# Patient Record
Sex: Female | Born: 1995 | Race: Black or African American | Hispanic: No | Marital: Single | Smoking: Never smoker
Health system: Southern US, Community
[De-identification: ages and names within clinical notes are randomized; demographics above are authoritative.]

## PROBLEM LIST (undated history)

## (undated) DIAGNOSIS — A6000 Herpesviral infection of urogenital system, unspecified: Secondary | ICD-10-CM

## (undated) DIAGNOSIS — J45909 Unspecified asthma, uncomplicated: Secondary | ICD-10-CM

---
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
## 2016-03-05 ENCOUNTER — Emergency Department
Admission: EM | Admit: 2016-03-05 | Discharge: 2016-03-05 | Disposition: A | Discharge status: Home / Self Care | Payer: Self-pay | Attending: Emergency Medicine | Admitting: Emergency Medicine

## 2016-03-05 ENCOUNTER — Encounter: Payer: Self-pay | Admitting: Emergency Medicine

## 2016-03-05 DIAGNOSIS — H109 Unspecified conjunctivitis: Secondary | ICD-10-CM | POA: Insufficient documentation

## 2016-03-05 HISTORY — DX: Herpesviral infection of urogenital system, unspecified: A60.00

## 2016-03-05 MED ORDER — GENTAMICIN SULFATE 0.3 % OP SOLN: 1.0000 [drp] | OPHTHALMIC | Status: DC

## 2016-03-05 NOTE — Discharge Instructions (Signed)
Bacterial Conjunctivitis Bacterial conjunctivitis (commonly called pink eye) is redness, soreness, or puffiness (inflammation) of the white part of your eye. It is caused by a germ called bacteria. These germs can easily spread from person to person (contagious). Your eye often will become red or pink. Your eye may also become irritated, watery, or have a thick discharge.  HOME CARE   Apply a cool, clean washcloth over closed eyelids. Do this for 10-20 minutes, 3-4 times a day while you have pain.  Gently wipe away any fluid coming from the eye with a warm, wet washcloth or cotton ball.  Wash your hands often with soap and water. Use paper towels to dry your hands.  Do not share towels or washcloths.  Change or wash your pillowcase every day.  Do not use eye makeup until the infection is gone.  Do not use machines or drive if your vision is blurry.  Stop using contact lenses. Do not use them again until your doctor says it is okay.  Do not touch the tip of the eye drop bottle or medicine tube with your fingers when you put medicine on the eye. GET HELP RIGHT AWAY IF:   Your eye is not better after 3 days of starting your medicine.  You have a yellowish fluid coming out of the eye.  You have more pain in the eye.  Your eye redness is spreading.  Your vision becomes blurry.  You have a fever or lasting symptoms for more than 2-3 days.  You have a fever and your symptoms suddenly get worse.  You have pain in the face.  Your face gets red or puffy (swollen). MAKE SURE YOU:   Understand these instructions.  Will watch this condition.  Will get help right away if you are not doing well or get worse.   This information is not intended to replace advice given to you by your health care provider. Make sure you discuss any questions you have with your health care provider.   Document Released: 05/19/2008 Document Revised: 07/27/2012 Document Reviewed: 04/15/2012 Elsevier  Interactive Patient Education 2016 [employer]  

## 2016-03-05 NOTE — ED Provider Notes (Signed)
[facility] Emergency Department Provider Note   ____________________________________________  Time seen: Approximately 10:55 AM  I have reviewed the triage vital signs and the nursing notes.   HISTORY  Chief Complaint Eye Drainage    HPI Deanna Soto is a 20 y.o. female patient complaining of left eye drainage and matted eyelids 3 days. Patient denies use of contact lenses. Patient denies any vision change. Patient state her significant other has the same complaint. Patient denies any URI signs symptoms. No palliative measures taken for this complaint. Patient rates her pain discomfort as a 6/10.   Past Medical History  Diagnosis Date  . Herpes genitalia     There are no active problems to display for this patient.   History reviewed. No pertinent past surgical history.  Current Outpatient Rx  Name  Route  Sig  Dispense  Refill  . gentamicin (GARAMYCIN) 0.3 % ophthalmic solution   Both Eyes   Place 1 drop into both eyes every 4 (four) hours.   5 mL   0     Allergies Review of patient's allergies indicates no known allergies.  No family history on file.  Social History Social History  Substance Use Topics  . Smoking status: Never Smoker   . Smokeless tobacco: None  . Alcohol Use: None    Review of Systems Constitutional: No fever/chills Eyes: No visual changes. Drainage, redness, and matted eyelid. ENT: No sore throat. Cardiovascular: Denies chest pain. Respiratory: Denies shortness of breath. Gastrointestinal: No abdominal pain.  No nausea, no vomiting.  No diarrhea.  No constipation. Genitourinary: Negative for dysuria. Musculoskeletal: Negative for back pain. Skin: Negative for rash. Neurological: Negative for headaches, focal weakness or numbness.    ____________________________________________   PHYSICAL EXAM:  VITAL SIGNS: ED Triage Vitals  Enc Vitals Group     BP 03/05/16 1052 137/83 mmHg     Pulse  Rate 03/05/16 1052 86     Resp 03/05/16 1052 18     Temp 03/05/16 1052 98.5 F (36.9 C)     Temp Source 03/05/16 1052 Oral     SpO2 03/05/16 1052 100 %     Weight 03/05/16 1052 180 lb (81.647 kg)     Height 03/05/16 1052  (1.676 m)     Head Cir --      Peak Flow --      Pain Score 03/05/16 1052 6     Pain Loc --      Pain Edu? --      Excl. in GC? --     Constitutional: Alert and oriented. Well appearing and in no acute distress. Eyes: Conjunctivae are Erythematous. PERRL. EOMI. matted eyelids and purulent discharge from the left eye. Head: Atraumatic. Nose: No congestion/rhinnorhea. Mouth/Throat: Mucous membranes are moist.  Oropharynx non-erythematous. Neck: No stridor.  No cervical spine tenderness to palpation. Hematological/Lymphatic/Immunilogical: No cervical lymphadenopathy. Cardiovascular: Normal rate, regular rhythm. Grossly normal heart sounds.  Good peripheral circulation. Respiratory: Normal respiratory effort.  No retractions. Lungs CTAB. Gastrointestinal: Soft and nontender. No distention. No abdominal bruits. No CVA tenderness. Musculoskeletal: No lower extremity tenderness nor edema.  No joint effusions. Neurologic:  Normal speech and language. No gross focal neurologic deficits are appreciated. No gait instability. Skin:  Skin is warm, dry and intact. No rash noted. Psychiatric: Mood and affect are normal. Speech and behavior are normal.  ____________________________________________   LABS (all labs ordered are listed, but only abnormal results are displayed)  Labs Reviewed - No  data to display ____________________________________________  EKG   ____________________________________________  RADIOLOGY   ____________________________________________   PROCEDURES  Procedure(s) performed: None  Procedures  Critical Care performed: No  ____________________________________________   INITIAL IMPRESSION / ASSESSMENT AND PLAN / ED  COURSE  Pertinent labs & imaging results that were available during my care of the patient were reviewed by me and considered in my medical decision making (see chart for details).  Bacterial conjunctivitis. She given discharge Instructions. Patient given a prescription for gentamicin eyedrops. Advised [facility] condition persists ____________________________________________   FINAL CLINICAL IMPRESSION(S) / ED DIAGNOSES  Final diagnoses:  Bacterial conjunctivitis of left eye      NEW MEDICATIONS STARTED DURING THIS VISIT:  New Prescriptions   GENTAMICIN (GARAMYCIN) 0.3 % OPHTHALMIC SOLUTION    Place 1 drop into both eyes every 4 (four) hours.     Note:  This document was prepared using Dragon voice recognition software and may include unintentional dictation errors.    Joni ReiningRonald K Smith, PA-C 03/05/16 1100  Sharyn CreamerMark Quale, MD 03/07/16 [phone]

## 2016-03-05 NOTE — ED Notes (Signed)
Left eye drainage , irritation x3 days

## 2016-03-10 ENCOUNTER — Encounter: Payer: Self-pay | Admitting: Emergency Medicine

## 2016-03-10 ENCOUNTER — Emergency Department
Admission: EM | Admit: 2016-03-10 | Discharge: 2016-03-10 | Disposition: A | Discharge status: Home / Self Care | Payer: Self-pay | Attending: Emergency Medicine | Admitting: Emergency Medicine

## 2016-03-10 DIAGNOSIS — H1033 Unspecified acute conjunctivitis, bilateral: Secondary | ICD-10-CM

## 2016-03-10 DIAGNOSIS — H109 Unspecified conjunctivitis: Secondary | ICD-10-CM | POA: Insufficient documentation

## 2016-03-10 MED ORDER — ERYTHROMYCIN 5 MG/GM OP OINT: 1.0000 "application " | TOPICAL_OINTMENT | Freq: Four times a day (QID) | OPHTHALMIC | Status: DC

## 2016-03-10 NOTE — ED Provider Notes (Signed)
[facility] Emergency Department Provider Note  ____________________________________________  Time seen: Approximately 1030 AM  I have reviewed the triage vital signs and the nursing notes.   HISTORY  Chief Complaint Conjunctivitis   HPI Deanna Soto is a 20 y.o. female presents with a complaint of redness and drainage to both eyes. Patient reports overall her symptoms have been present for 1 week. Patient states her boyfriend has the same complaints and states that she "got it from him ". Patient states in the last 5 days she has had redness, yellowish drainage and itching to her left eye with associated matting of eyelids,but reports the same symptoms started to her right eye yesterday.    Patient states that she was diagnosed and treated for pinkeye a fewdays after her boyfriend symptoms onset. Patient states that she was seen in the emergency room 5 days ago for the same complaint. Patient states she was given gentamicin eyedrops. Patient states that she was putting this in her left eye as she states at the time only her left eye was involved. Patient reports that she felt like she kept blinking after the eyedrops causing the eye drops to fall out of her eyes.Patient states that she now has symptoms in both her eyes. Reports some itching to both eyes. States eyes feel irritated, and states minimal pain discomfort at 2 out of 10. Denies vision changes but reports she does have some blurry vision only when the drainage is present in her eyes. Patient states when she removes the drainage her vision is normal. Patient states that she does not wear glasses or contacts but states she work glasses as a child and knows that she needs to be wearing glasses as an adult.  Denies any other triggers. Denies foreign bodies, chemical exposure, trauma. Denies foreign body sensation. Denies any surrounding eye pain. Denies fevers. Denies recent sickness, cough, congestion,  fevers or rash.  Patient's last menstrual period was now   Past Medical History  Diagnosis Date  . Herpes genitalia     There are no active problems to display for this patient.   History reviewed. No pertinent past surgical history.  Current Outpatient Rx  Name  Route  Sig  Dispense  Refill  .           Marland Kitchen             Allergies Review of patient's allergies indicates no known allergies.  No family history on file.  Social History Social History  Substance Use Topics  . Smoking status: Never Smoker   . Smokeless tobacco: None  . Alcohol Use: None    Review of Systems Constitutional: No fever/chills Eyes: No visual changes.As above.  ENT: No sore throat. Cardiovascular: Denies chest pain. Respiratory: Denies shortness of breath. Gastrointestinal: No abdominal pain.  No nausea, no vomiting.  No diarrhea.  No constipation. Genitourinary: Negative for dysuria. Musculoskeletal: Negative for back pain. Skin: Negative for rash. Neurological: Negative for headaches, focal weakness or numbness.  10-point ROS otherwise negative.  ____________________________________________   PHYSICAL EXAM:  VITAL SIGNS: ED Triage Vitals  Enc Vitals Group     BP 03/10/16 1031 134/83 mmHg     Pulse Rate 03/10/16 1031 95     Resp --      Temp 03/10/16 1031 98.3 F (36.8 C)     Temp Source 03/10/16 1031 Oral     SpO2 03/10/16 1031 100 %     Weight --  Height --      Head Cir --      Peak Flow --      Pain Score 03/10/16 1025 1     Pain Loc --      Pain Edu? --      Excl. in GC? --     Visual Acuity  Right Eye Distance: 20/40 Left Eye Distance: 20/40 Bilateral Distance: 20/50  Right Eye Near:   Left Eye Near:    Bilateral Near:      Constitutional: Alert and oriented. Well appearing and in no acute distress. Eyes: Bilateral conjunctivae with mild to moderate injection, bilateral eyes with mild whitish yellowish discharge with crusting around eyelids, no foreign  bodies seen, no surrounding swelling bilaterally, no surrounding erythema or rash. PERRL. EOMI. no pain with EOMs. Head: Atraumatic.  Ears: no erythema, normal TMs bilaterally.   Nose: No congestion/rhinnorhea.  Mouth/Throat: Mucous membranes are moist.  Oropharynx non-erythematous. Neck: No stridor.  No cervical spine tenderness to palpation. Cardiovascular: Normal rate, regular rhythm. Grossly normal heart sounds.  Good peripheral circulation. Respiratory: Normal respiratory effort.  No retractions. Lungs CTAB. No wheezes, rales or rhonchi. Gastrointestinal: Soft and nontender.  Musculoskeletal: Ambulatory with steady gait. Neurologic:  Normal speech and language.  Skin:  Skin is warm, dry and intact. No rash noted. Psychiatric: Mood and affect are normal. Speech and behavior are normal.    INITIAL IMPRESSION / ASSESSMENT AND PLAN / ED COURSE  Pertinent labs & imaging results that were available during my care of the patient were reviewed by me and considered in my medical decision making (see chart for details).  Well-appearing patient. No acute distress. Present for the complaints of bilateral eye redness, itching, drainage, matting and discharge. Patient was using gentamicin eyedrops are reports she felt like she was blinking the drops out and it was not staying in. Patient reports gradual progression to her right eye. Denies any other aggravating factors or triggers. Patient reports her boyfriend with the same complaints. Denies vision changes. Will treat bacterial conjunctivitis with erythromycin ointment. Instructed to follow-up with her primary care physician this week. Encourage ophthalmology follow-up if no improvement. Discussed seek immediate follow-up for vision change, eye pain or worsening concerns. Discussed indication, risks and benefits of medications with patient.  Discussed follow up with Primary care physician this week. Discussed follow up and return parameters including  no resolution or any worsening concerns. Patient verbalized understanding and agreed to plan.   ____________________________________________   FINAL CLINICAL IMPRESSION(S) / ED DIAGNOSES  Final diagnoses:  Acute bacterial conjunctivitis of both eyes     Discharge Medication List as of 03/10/2016 11:03 AM      Note: This dictation was prepared with Dragon dictation along with smaller phrase technology. Any transcriptional errors that result from this process are unintentional.       Renford DillsLindsey Myleka Moncure, NP 03/10/16 1155  Rockne MenghiniAnne-Caroline Norman, MD 03/10/16 1526

## 2016-03-10 NOTE — Discharge Instructions (Signed)
Use medication as prescribed. Practice good hand hygiene. Follow up with ophthalmology this week as discussed.   Follow up with your primary care physician this week as needed. Return to Urgent care for new or worsening concerns.    Bacterial Conjunctivitis Bacterial conjunctivitis, commonly called pink eye, is an inflammation of the clear membrane that covers the white part of the eye (conjunctiva). The inflammation can also happen on the underside of the eyelids. The blood vessels in the conjunctiva become inflamed, causing the eye to become red or pink. Bacterial conjunctivitis may spread easily from one eye to another and from person to person (contagious).  CAUSES  Bacterial conjunctivitis is caused by bacteria. The bacteria may come from your own skin, your upper respiratory tract, or from someone else with bacterial conjunctivitis. SYMPTOMS  The normally white color of the eye or the underside of the eyelid is usually pink or red. The pink eye is usually associated with irritation, tearing, and some sensitivity to light. Bacterial conjunctivitis is often associated with a thick, yellowish discharge from the eye. The discharge may turn into a crust on the eyelids overnight, which causes your eyelids to stick together. If a discharge is present, there may also be some blurred vision in the affected eye. DIAGNOSIS  Bacterial conjunctivitis is diagnosed by your caregiver through an eye exam and the symptoms that you report. Your caregiver looks for changes in the surface tissues of your eyes, which may point to the specific type of conjunctivitis. A sample of any discharge may be collected on a cotton-tip swab if you have a severe case of conjunctivitis, if your cornea is affected, or if you keep getting repeat infections that do not respond to treatment. The sample will be sent to a lab to see if the inflammation is caused by a bacterial infection and to see if the infection will respond to  antibiotic medicines. TREATMENT   Bacterial conjunctivitis is treated with antibiotics. Antibiotic eyedrops are most often used. However, antibiotic ointments are also available. Antibiotics pills are sometimes used. Artificial tears or eye washes may ease discomfort. HOME CARE INSTRUCTIONS   To ease discomfort, apply a cool, clean washcloth to your eye for 10-20 minutes, 3-4 times a day.  Gently wipe away any drainage from your eye with a warm, wet washcloth or a cotton ball.  Wash your hands often with soap and water. Use paper towels to dry your hands.  Do not share towels or washcloths. This may spread the infection.  Change or wash your pillowcase every day.  You should not use eye makeup until the infection is gone.  Do not operate machinery or drive if your vision is blurred.  Stop using contact lenses. Ask your caregiver how to sterilize or replace your contacts before using them again. This depends on the type of contact lenses that you use.  When applying medicine to the infected eye, do not touch the edge of your eyelid with the eyedrop bottle or ointment tube. SEEK IMMEDIATE MEDICAL CARE IF:   Your infection has not improved within 3 days after beginning treatment.  You had yellow discharge from your eye and it returns.  You have increased eye pain.  Your eye redness is spreading.  Your vision becomes blurred.  You have a fever or persistent symptoms for more than 2-3 days.  You have a fever and your symptoms suddenly get worse.  You have facial pain, redness, or swelling. MAKE SURE YOU:   Understand these instructions.  Will watch your condition.  Will get help right away if you are not doing well or get worse.   This information is not intended to replace advice given to you by your health care provider. Make sure you discuss any questions you have with your health care provider.   Document Released: 08/10/2005 Document Revised: 08/31/2014 Document  Reviewed: 01/11/2012 Elsevier Interactive Patient Education [employer].

## 2016-03-10 NOTE — ED Notes (Signed)
ENT, primary and fall assessment done at 1115

## 2016-03-10 NOTE — ED Notes (Signed)
C/o redness in both eyes.  No visual changes

## 2016-05-31 ENCOUNTER — Emergency Department: Payer: Self-pay

## 2016-05-31 ENCOUNTER — Encounter: Payer: Self-pay | Admitting: Emergency Medicine

## 2016-05-31 ENCOUNTER — Emergency Department
Admission: EM | Admit: 2016-05-31 | Discharge: 2016-05-31 | Disposition: A | Discharge status: Home / Self Care | Payer: Self-pay | Attending: Emergency Medicine | Admitting: Emergency Medicine

## 2016-05-31 DIAGNOSIS — R112 Nausea with vomiting, unspecified: Secondary | ICD-10-CM | POA: Insufficient documentation

## 2016-05-31 DIAGNOSIS — R102 Pelvic and perineal pain: Secondary | ICD-10-CM | POA: Insufficient documentation

## 2016-05-31 DIAGNOSIS — Z79899 Other long term (current) drug therapy: Secondary | ICD-10-CM | POA: Insufficient documentation

## 2016-05-31 HISTORY — DX: Unspecified asthma, uncomplicated: J45.909

## 2016-05-31 LAB — URINALYSIS COMPLETE WITH MICROSCOPIC (ARMC ONLY)
BILIRUBIN URINE: NEGATIVE
Bacteria, UA: NONE SEEN
Glucose, UA: NEGATIVE mg/dL
HGB URINE DIPSTICK: NEGATIVE
NITRITE: NEGATIVE
PH: 6 (ref 5.0–8.0)
Protein, ur: 100 mg/dL — AB
SPECIFIC GRAVITY, URINE: 1.028 (ref 1.005–1.030)

## 2016-05-31 LAB — COMPREHENSIVE METABOLIC PANEL
ALT: 10 U/L — ABNORMAL LOW (ref 14–54)
AST: 15 U/L (ref 15–41)
Albumin: 4.5 g/dL (ref 3.5–5.0)
Alkaline Phosphatase: 64 U/L (ref 38–126)
Anion gap: 9 (ref 5–15)
BUN: 7 mg/dL (ref 6–20)
CHLORIDE: 105 mmol/L (ref 101–111)
CO2: 25 mmol/L (ref 22–32)
CREATININE: 0.75 mg/dL (ref 0.44–1.00)
Calcium: 9.5 mg/dL (ref 8.9–10.3)
Glucose, Bld: 106 mg/dL — ABNORMAL HIGH (ref 65–99)
POTASSIUM: 3.3 mmol/L — AB (ref 3.5–5.1)
Sodium: 139 mmol/L (ref 135–145)
Total Bilirubin: 0.7 mg/dL (ref 0.3–1.2)
Total Protein: 7.8 g/dL (ref 6.5–8.1)

## 2016-05-31 LAB — CBC
HEMATOCRIT: 40.4 % (ref 35.0–47.0)
Hemoglobin: 14 g/dL (ref 12.0–16.0)
MCH: 29.2 pg (ref 26.0–34.0)
MCHC: 34.6 g/dL (ref 32.0–36.0)
MCV: 84.5 fL (ref 80.0–100.0)
PLATELETS: 295 10*3/uL (ref 150–440)
RBC: 4.79 MIL/uL (ref 3.80–5.20)
RDW: 14.6 % — ABNORMAL HIGH (ref 11.5–14.5)
WBC: 9.7 10*3/uL (ref 3.6–11.0)

## 2016-05-31 LAB — CHLAMYDIA/NGC RT PCR (ARMC ONLY)
CHLAMYDIA TR: DETECTED — AB
N gonorrhoeae: NOT DETECTED

## 2016-05-31 LAB — PREGNANCY, URINE: Preg Test, Ur: NEGATIVE

## 2016-05-31 LAB — LIPASE, BLOOD: LIPASE: 15 U/L (ref 11–51)

## 2016-05-31 MED ORDER — KETOROLAC TROMETHAMINE 30 MG/ML IJ SOLN
15.0000 mg | Freq: Once | INTRAMUSCULAR | Status: AC
  Administered 2016-05-31: 15 mg via INTRAVENOUS
  Filled 2016-05-31: qty 1

## 2016-05-31 MED ORDER — CEFTRIAXONE SODIUM 250 MG IJ SOLR
250.0000 mg | Freq: Once | INTRAMUSCULAR | Status: AC
  Administered 2016-05-31: 250 mg via INTRAMUSCULAR
  Filled 2016-05-31: qty 250

## 2016-05-31 MED ORDER — IOPAMIDOL (ISOVUE-300) INJECTION 61%
100.0000 mL | Freq: Once | INTRAVENOUS | Status: AC | PRN
  Administered 2016-05-31: 100 mL via INTRAVENOUS

## 2016-05-31 MED ORDER — ONDANSETRON HCL 4 MG/2ML IJ SOLN
4.0000 mg | Freq: Once | INTRAMUSCULAR | Status: AC
  Administered 2016-05-31: 4 mg via INTRAVENOUS
  Filled 2016-05-31: qty 2

## 2016-05-31 MED ORDER — DOXYCYCLINE MONOHYDRATE 100 MG PO TABS: 100.0000 mg | ORAL_TABLET | Freq: Two times a day (BID) | ORAL | 0 refills | Status: DC

## 2016-05-31 MED ORDER — DOXYCYCLINE HYCLATE 100 MG PO TABS
100.0000 mg | ORAL_TABLET | Freq: Once | ORAL | Status: AC
  Administered 2016-05-31: 100 mg via ORAL
  Filled 2016-05-31: qty 1

## 2016-05-31 MED ORDER — LIDOCAINE HCL (PF) 1 % IJ SOLN
INTRAMUSCULAR | Status: AC
  Filled 2016-05-31: qty 5

## 2016-05-31 MED ORDER — HYDROCODONE-ACETAMINOPHEN 5-325 MG PO TABS: 1.0000 | ORAL_TABLET | Freq: Four times a day (QID) | ORAL | 0 refills | Status: DC | PRN

## 2016-05-31 MED ORDER — SODIUM CHLORIDE 0.9 % IV BOLUS (SEPSIS)
500.0000 mL | Freq: Once | INTRAVENOUS | Status: AC
  Administered 2016-05-31: 500 mL via INTRAVENOUS

## 2016-05-31 MED ORDER — IOPAMIDOL (ISOVUE-300) INJECTION 61%
30.0000 mL | Freq: Once | INTRAVENOUS | Status: AC | PRN
  Administered 2016-05-31: 30 mL via ORAL

## 2016-05-31 NOTE — ED Triage Notes (Signed)
Pt presents to ED with reports of lower abdominal pain and vomiting for three days. Pt denies diarrhea. Pt denies fever. Pt denies dysuria.

## 2016-05-31 NOTE — Discharge Instructions (Signed)
Please return immediately if condition worsens. Please contact her primary physician or the physician you were given for referral. If you have any specialist physicians involved in her treatment and plan please also contact them. Thank you for using [facility]. ° °

## 2016-05-31 NOTE — ED Notes (Signed)
Lab called and notified of add on urine preg.

## 2016-05-31 NOTE — ED Provider Notes (Signed)
Time Seen: Approximately1132  I have reviewed the triage notes  Chief Complaint: Abdominal Pain and Emesis   History of Present Illness: Deanna Soto is a 20 y.o. female's with lower middle quadrant abdominal pain for 3 days. She states pain started out intermittent and now is relatively constant. She denies any unusual vaginal discharge or bleeding. She states normally her menstrual periods are regular. Patient is requesting pelvic exam she states that she does not trust her sexual partner at this time. Where from having any fever, dysuria or hematuria. The patient's states the pain is relatively constant at this time and she's taken ibuprofen at home without relief. She has no past surgical issues. She denies any loose stool or diarrhea.   Past Medical History:  Diagnosis Date  . Herpes genitalia     There are no active problems to display for this patient.   History reviewed. No pertinent surgical history.  History reviewed. No pertinent surgical history.  Current Outpatient Rx  . Order #: [id number] Class: Print  . Order #: [id number] Class: Print    Allergies:  Review of patient's allergies indicates no known allergies.  Family History: No family history on file.  Social History: Social History  Substance Use Topics  . Smoking status: Never Smoker  . Smokeless tobacco: Never Used  . Alcohol use No     Review of Systems:   10 point review of systems was performed and was otherwise negative:  Constitutional: No fever Eyes: No visual disturbances ENT: No sore throat, ear pain Cardiac: No chest pain Respiratory: No shortness of breath, wheezing, or stridor Abdomen: Lower middle quadrant abdominal pain with nausea and vomiting at home. No hematemesis or biliary emesis. No loose stool or diarrhea or constipation. Endocrine: No weight loss, No night sweats Extremities: No peripheral edema, cyanosis Skin: No rashes, easy bruising Neurologic: No focal  weakness, trouble with speech or swollowing Urologic: No dysuria, Hematuria, or urinary frequency   Physical Exam:  ED Triage Vitals  Enc Vitals Group     BP 05/31/16 1041 133/69     Pulse Rate 05/31/16 1041 98     Resp 05/31/16 1041 18     Temp 05/31/16 1041 98.4 F (36.9 C)     Temp Source 05/31/16 1041 Oral     SpO2 05/31/16 1041 99 %     Weight 05/31/16 1041 170 lb (77.1 kg)     Height 05/31/16 1041 5\' 7"  (1.702 m)     Head Circumference --      Peak Flow --      Pain Score 05/31/16 1042 8     Pain Loc --      Pain Edu? --      Excl. in GC? --     General: Awake , Alert , and Oriented times 3; GCS 15 Head: Normal cephalic , atraumatic Eyes: Pupils equal , round, reactive to light Nose/Throat: No nasal drainage, patent upper airway without erythema or exudate.  Neck: Supple, Full range of motion, No anterior adenopathy or palpable thyroid masses Lungs: Clear to ascultation without wheezes , rhonchi, or rales Heart: Regular rate, regular rhythm without murmurs , gallops , or rubs Abdomen: Patient's tenderness lower middle quadrant with more radiation toward the left lower quadrant than the right. Pain is mostly midline and negative Murphy's sign. No focal tenderness over McBurney's point. Bowel sounds are positive and symmetric in all 4 quadrants. Negative psoas, negative obturator sign.     Extremities: 2 plus symmetric  pulses. No edema, clubbing or cyanosis Neurologic: normal ambulation, Motor symmetric without deficits, sensory intact Skin: warm, dry, no rashes   Labs:   All laboratory work was reviewed including any pertinent negatives or positives listed below:  Labs Reviewed  COMPREHENSIVE METABOLIC PANEL - Abnormal; Notable for the following:       Result Value   Potassium 3.3 (*)    Glucose, Bld 106 (*)    ALT 10 (*)    All other components within normal limits  CBC - Abnormal; Notable for the following:    RDW 14.6 (*)    All other components within normal  limits  URINALYSIS COMPLETEWITH MICROSCOPIC ([facility] ONLY) - Abnormal; Notable for the following:    Color, Urine YELLOW (*)    APPearance HAZY (*)    Ketones, ur 2+ (*)    Protein, ur 100 (*)    Leukocytes, UA 2+ (*)    Squamous Epithelial / LPF 6-30 (*)    All other components within normal limits  CHLAMYDIA/NGC RT PCR ([facility] ONLY)  LIPASE, BLOOD  PREGNANCY, URINE  Urinalysis came back positive for Chlamydia.   Radiology:    "Ct Abdomen Pelvis W Contrast  Result Date: 05/31/2016 CLINICAL DATA:  Nausea and vomiting with lower abdominal pain, primarily left side, for 3 days EXAM: CT ABDOMEN AND PELVIS WITH CONTRAST TECHNIQUE: Multidetector CT imaging of the abdomen and pelvis was performed using the standard protocol following bolus administration of intravenous contrast. Oral contrast was also administered. CONTRAST:  100mL ISOVUE-300 IOPAMIDOL (ISOVUE-300) INJECTION 61% COMPARISON:  None. FINDINGS: Lower chest: Lung bases are clear. Hepatobiliary: There is fatty infiltration near the fissure for the ligamentum teres. No focal liver lesions are demonstrable. Gallbladder wall is not appreciably thickened. There is no biliary duct dilatation. Pancreas: No pancreatic mass or inflammatory focus. Spleen: No splenic lesions are evident. Adrenals/Urinary Tract: Adrenals appear normal bilaterally. Kidneys bilaterally show no mass or hydronephrosis on either side. There is no renal or ureteral calculus on either side. Urinary bladder is midline with wall thickness within normal limits. Stomach/Bowel: There is no appreciable bowel wall or mesenteric thickening. There are occasional sigmoid diverticula without diverticulitis. There is no evident bowel obstruction. No free air or portal venous air. Vascular/Lymphatic: There is no abdominal aortic aneurysm. No vascular lesions are evident. There is no appreciable adenopathy by size criteria in the abdomen or pelvis. There are scattered subcentimeter  mesenteric lymph nodes in the abdomen, slightly more on the right than on the left. Reproductive: Uterus is anteverted. There is a dominant follicle in the right ovary measuring 2.4 x 2.0 cm. Apart from this physiologic follicle, there is no pelvic mass. There is no pelvic fluid collection. Other: Appendix appears unremarkable. There is no ascites or abscess in the abdomen or pelvis. There is a minimal ventral hernia containing only fat. Musculoskeletal: There are no blastic or lytic bone lesions. There is no intramuscular or abdominal wall lesion. IMPRESSION: There is no bowel wall thickening or bowel obstruction evident. Appendix region appears normal. No diverticulitis evident. There are occasional sigmoid diverticula. No abscess. Scattered subcentimeter mesenteric lymph nodes are regarded as nonspecific. In the appropriate clinical setting. These lymph nodes could be indicative of a degree of mesenteric adenitis. No renal or ureteral calculi.  No hydronephrosis. Electronically Signed   By: Bretta BangWilliam  Woodruff III M.D.   On: 05/31/2016 13:42  " I personally reviewed the radiologic studies   ED Course:  Differential diagnosis includes but is not exclusive to ovarian  cyst, ovarian torsion, acute appendicitis, urinary tract infection, endometriosis, bowel obstruction, colitis, renal colic, gastroenteritis, etc.  Given the patient's current presentation and objective findings appears that she does have pelvic inflammatory disease. The patient's CAT scan did not show any evidence of a surgical abdomen at this time. Given the Chlamydia in her urinalysis sample I felt we should initiate antibiotic therapy. The patient was conservatively treated with Rocephin 250 mg IM and will be started on a course of doxycycline. Dens on the antibiotic and advised take Tylenol over-the-counter for pain. She was referred to OB/GYN unassigned. Clinical Course     Assessment:  Mild pelvic inflammatory disease      Plan:  * Outpatient " New Prescriptions   DOXYCYCLINE (ADOXA) 100 MG TABLET    Take 1 tablet (100 mg total) by mouth 2 (two) times daily.   HYDROCODONE-ACETAMINOPHEN (NORCO) 5-325 MG TABLET    Take 1 tablet by mouth every 6 (six) hours as needed for moderate pain.  " Patient was advised to return immediately if condition worsens. Patient was advised to follow up with their primary care physician or other specialized physicians involved in their outpatient care. The patient and/or family member/power of attorney had laboratory results reviewed at the bedside. All questions and concerns were addressed and appropriate discharge instructions were distributed by the nursing staff.             Jennye Moccasin, MD 05/31/16 1420

## 2016-06-01 ENCOUNTER — Telehealth: Payer: Self-pay | Admitting: Emergency Medicine

## 2016-06-01 NOTE — Telephone Encounter (Signed)
Called patient to inform of positive chlamydia test.  She was treated in the ED.  Left message.

## 2016-06-08 ENCOUNTER — Emergency Department
Admission: EM | Admit: 2016-06-08 | Discharge: 2016-06-08 | Disposition: A | Discharge status: Home / Self Care | Payer: Self-pay | Attending: Emergency Medicine | Admitting: Emergency Medicine

## 2016-06-08 ENCOUNTER — Emergency Department: Payer: Self-pay

## 2016-06-08 ENCOUNTER — Encounter: Payer: Self-pay | Admitting: Emergency Medicine

## 2016-06-08 DIAGNOSIS — N760 Acute vaginitis: Secondary | ICD-10-CM | POA: Insufficient documentation

## 2016-06-08 DIAGNOSIS — B9689 Other specified bacterial agents as the cause of diseases classified elsewhere: Secondary | ICD-10-CM

## 2016-06-08 DIAGNOSIS — J45909 Unspecified asthma, uncomplicated: Secondary | ICD-10-CM | POA: Insufficient documentation

## 2016-06-08 DIAGNOSIS — Z79899 Other long term (current) drug therapy: Secondary | ICD-10-CM | POA: Insufficient documentation

## 2016-06-08 DIAGNOSIS — R1013 Epigastric pain: Secondary | ICD-10-CM

## 2016-06-08 DIAGNOSIS — R109 Unspecified abdominal pain: Secondary | ICD-10-CM

## 2016-06-08 DIAGNOSIS — R1110 Vomiting, unspecified: Secondary | ICD-10-CM | POA: Insufficient documentation

## 2016-06-08 LAB — URINALYSIS COMPLETE WITH MICROSCOPIC (ARMC ONLY)
BILIRUBIN URINE: NEGATIVE
Bacteria, UA: NONE SEEN
Glucose, UA: NEGATIVE mg/dL
Hgb urine dipstick: NEGATIVE
NITRITE: NEGATIVE
PH: 7 (ref 5.0–8.0)
PROTEIN: 30 mg/dL — AB
SPECIFIC GRAVITY, URINE: 1.019 (ref 1.005–1.030)

## 2016-06-08 LAB — CBC WITH DIFFERENTIAL/PLATELET
Basophils Absolute: 0.1 10*3/uL (ref 0–0.1)
Basophils Relative: 1 %
EOS ABS: 0 10*3/uL (ref 0–0.7)
EOS PCT: 1 %
HCT: 43.5 % (ref 35.0–47.0)
Hemoglobin: 14.9 g/dL (ref 12.0–16.0)
LYMPHS ABS: 2.2 10*3/uL (ref 1.0–3.6)
Lymphocytes Relative: 29 %
MCH: 29 pg (ref 26.0–34.0)
MCHC: 34.2 g/dL (ref 32.0–36.0)
MCV: 84.6 fL (ref 80.0–100.0)
MONO ABS: 0.4 10*3/uL (ref 0.2–0.9)
MONOS PCT: 5 %
Neutro Abs: 4.8 10*3/uL (ref 1.4–6.5)
Neutrophils Relative %: 64 %
PLATELETS: 282 10*3/uL (ref 150–440)
RBC: 5.14 MIL/uL (ref 3.80–5.20)
RDW: 14.9 % — AB (ref 11.5–14.5)
WBC: 7.5 10*3/uL (ref 3.6–11.0)

## 2016-06-08 LAB — COMPREHENSIVE METABOLIC PANEL
ALT: 12 U/L — ABNORMAL LOW (ref 14–54)
ANION GAP: 9 (ref 5–15)
AST: 16 U/L (ref 15–41)
Albumin: 4.5 g/dL (ref 3.5–5.0)
Alkaline Phosphatase: 56 U/L (ref 38–126)
BUN: 7 mg/dL (ref 6–20)
CHLORIDE: 105 mmol/L (ref 101–111)
CO2: 22 mmol/L (ref 22–32)
Calcium: 9.5 mg/dL (ref 8.9–10.3)
Creatinine, Ser: 0.76 mg/dL (ref 0.44–1.00)
Glucose, Bld: 111 mg/dL — ABNORMAL HIGH (ref 65–99)
Potassium: 3.7 mmol/L (ref 3.5–5.1)
SODIUM: 136 mmol/L (ref 135–145)
Total Bilirubin: 0.8 mg/dL (ref 0.3–1.2)
Total Protein: 7.6 g/dL (ref 6.5–8.1)

## 2016-06-08 LAB — WET PREP, GENITAL
SPERM: NONE SEEN
Trich, Wet Prep: NONE SEEN
YEAST WET PREP: NONE SEEN

## 2016-06-08 LAB — CHLAMYDIA/NGC RT PCR (ARMC ONLY)
Chlamydia Tr: NOT DETECTED
N gonorrhoeae: NOT DETECTED

## 2016-06-08 LAB — POCT PREGNANCY, URINE: Preg Test, Ur: NEGATIVE

## 2016-06-08 LAB — LIPASE, BLOOD: LIPASE: 21 U/L (ref 11–51)

## 2016-06-08 MED ORDER — SODIUM CHLORIDE 0.9 % IV BOLUS (SEPSIS)
1000.0000 mL | Freq: Once | INTRAVENOUS | Status: AC
  Administered 2016-06-08: 1000 mL via INTRAVENOUS

## 2016-06-08 MED ORDER — FAMOTIDINE 20 MG PO TABS: 20.0000 mg | ORAL_TABLET | Freq: Two times a day (BID) | ORAL | 1 refills | Status: DC

## 2016-06-08 MED ORDER — ONDANSETRON HCL 4 MG PO TABS: 4.0000 mg | ORAL_TABLET | Freq: Three times a day (TID) | ORAL | 0 refills | Status: DC | PRN

## 2016-06-08 MED ORDER — METRONIDAZOLE 500 MG PO TABS: 500.0000 mg | ORAL_TABLET | Freq: Two times a day (BID) | ORAL | 0 refills | Status: DC

## 2016-06-08 MED ORDER — GI COCKTAIL ~~LOC~~
30.0000 mL | Freq: Once | ORAL | Status: AC
  Administered 2016-06-08: 30 mL via ORAL
  Filled 2016-06-08: qty 30

## 2016-06-08 NOTE — ED Provider Notes (Addendum)
[facility] Emergency Department Provider Note  ____________________________________________   I have reviewed the triage vital signs and the nursing notes.   HISTORY  Chief Complaint Abdominal Pain    HPI Deanna Soto is a 20 y.o. female who is recent was seen and evaluated for possible PID, did end up having positive chlamydia. Is still taking the doxycycline she states. She's not had any fever but she's had some nausea and has epigastric abdominal pain. This is different from the lower pelvic pain she was having. At the time of her previous evaluation, she had a negative CT scan. She feels that the medication might be making her nauseated. She denies any melena or bright red blood per rectum or diarrhea. She has epigastric abdominal pain principally when she vomits. She has not had a fever.She has not had any sexual activity since her diagnosis.      Past Medical History:  Diagnosis Date  . Asthma    as a child  . Herpes genitalia     There are no active problems to display for this patient.   History reviewed. No pertinent surgical history.  Prior to Admission medications   Medication Sig Start Date End Date Taking? Authorizing Provider  doxycycline (ADOXA) 100 MG tablet Take 1 tablet (100 mg total) by mouth 2 (two) times daily. 05/31/16   Jennye Moccasin, MD  erythromycin ophthalmic ointment Place 1 application into both eyes 4 (four) times daily. For seven days 03/10/16   Renford Dills, NP  gentamicin (GARAMYCIN) 0.3 % ophthalmic solution Place 1 drop into both eyes every 4 (four) hours. 03/05/16   Joni Reining, PA-C  HYDROcodone-acetaminophen (NORCO) 5-325 MG tablet Take 1 tablet by mouth every 6 (six) hours as needed for moderate pain. 05/31/16   Jennye Moccasin, MD    Allergies Review of patient's allergies indicates no known allergies.  No family history on file.  Social History Social History  Substance Use Topics  . Smoking  status: Never Smoker  . Smokeless tobacco: Never Used  . Alcohol use No    Review of Systems Constitutional: No fever/chills Eyes: No visual changes. ENT: No sore throat. No stiff neck no neck pain Cardiovascular: Denies chest pain. Respiratory: Denies shortness of breath. Gastrointestinal:   Positive vomiting.  No diarrhea.  No constipation. Genitourinary: Negative for dysuria. Musculoskeletal: Negative lower extremity swelling Skin: Negative for rash. Neurological: Negative for severe headaches, focal weakness or numbness. 10-point ROS otherwise negative.  ____________________________________________   PHYSICAL EXAM:  VITAL SIGNS: ED Triage Vitals  Enc Vitals Group     BP 06/08/16 1730 131/74     Pulse Rate 06/08/16 1730 100     Resp 06/08/16 1730 20     Temp 06/08/16 1730 98.3 F (36.8 C)     Temp Source 06/08/16 1730 Oral     SpO2 06/08/16 1730 100 %     Weight 06/08/16 1731 170 lb (77.1 kg)     Height 06/08/16 1731 5\' 7"  (1.702 m)     Head Circumference --      Peak Flow --      Pain Score 06/08/16 1731 10     Pain Loc --      Pain Edu? --      Excl. in GC? --     Constitutional: Alert and oriented. Well appearing and in no acute distress. Eyes: Conjunctivae are normal. PERRL. EOMI. Head: Atraumatic. Nose: No congestion/rhinnorhea. Mouth/Throat: Mucous membranes are moist.  Oropharynx  non-erythematous. Neck: No stridor.   Nontender with no meningismus Cardiovascular: Normal rate, regular rhythm. Grossly normal heart sounds.  Good peripheral circulation. Respiratory: Normal respiratory effort.  No retractions. Lungs CTAB. Abdominal: Soft and Tender palpation in the epigastric region. No distention. No guarding no rebound Back:  There is no focal tenderness or step off.  there is no midline tenderness there are no lesions noted. there is no CVA tenderness ZO:[id number] exam: Female nurse chaperone present, no external lesions noted, physiologic vaginal discharge  noted with no purulent discharge, no cervical motion tenderness, no adnexal tenderness or mass, there is minimal uterine tenderness no mass. No vaginal bleeding Musculoskeletal: No lower extremity tenderness, no upper extremity tenderness. No joint effusions, no DVT signs strong distal pulses no edema Neurologic:  Normal speech and language. No gross focal neurologic deficits are appreciated.  Skin:  Skin is warm, dry and intact. No rash noted. Psychiatric: Mood and affect are normal. Speech and behavior are normal.  ____________________________________________   LABS (all labs ordered are listed, but only abnormal results are displayed)  Labs Reviewed  CBC WITH DIFFERENTIAL/PLATELET - Abnormal; Notable for the following:       Result Value   RDW 14.9 (*)    All other components within normal limits  COMPREHENSIVE METABOLIC PANEL - Abnormal; Notable for the following:    Glucose, Bld 111 (*)    ALT 12 (*)    All other components within normal limits  URINALYSIS COMPLETEWITH MICROSCOPIC ([facility] ONLY) - Abnormal; Notable for the following:    Color, Urine YELLOW (*)    APPearance CLOUDY (*)    Ketones, ur 2+ (*)    Protein, ur 30 (*)    Leukocytes, UA 2+ (*)    Squamous Epithelial / LPF 6-30 (*)    All other components within normal limits  CHLAMYDIA/NGC RT PCR ([facility] ONLY)  WET PREP, GENITAL  LIPASE, BLOOD  POC URINE PREG, ED  POCT PREGNANCY, URINE   ____________________________________________  EKG  I personally interpreted any EKGs ordered by me or triage  ____________________________________________  RADIOLOGY  I reviewed any imaging ordered by me or triage that were performed during my shift and, if possible, patient and/or family made aware of any abnormal findings. ____________________________________________   PROCEDURES  Procedure(s) performed: None  Procedures  Critical Care performed:  None  ____________________________________________   INITIAL IMPRESSION / ASSESSMENT AND PLAN / ED COURSE  Pertinent labs & imaging results that were available during my care of the patient were reviewed by me and considered in my medical decision making (see chart for details).  Patient now with epigastric pain and vomiting after being on doxycycline. Her chlamydia test came back positive. CT was negative during prior visit. Abdomen is nonsurgical. Blood work is reassuring. She is afebrile with reassuring vital signs. Differential includes persistent PID although low suspicion for this being the primary pathology given her pelvic exam and the epigastric nature of her discomfort. This is a different discomfort that she had before. Patient has also had some vomiting and diarrhea since going home. Certainly could be an antibiotic associated pathology. She has not had any significant diarrhea to suggest C. difficile however. Just a few "loose" stools. She is mostly her epigastric region is uncomfortable from occasional vomiting. She has no reflux history although she does have some burning. We will obtain ultrasound of her gallbladder as a precaution as well as pelvic ultrasound to rule out TOA. Hughie Closs or other such pathologies are possible but not  thought likely given lack of white count afebrile and general normal exam.  ----------------------------------------- 9:58 PM on 06/08/2016 -----------------------------------------  Patient with no complaints at this time after GI cocktail her pain went completely away, ultrasound of her right upper quadrant and her pelvic ultrasound is negative there is no evidence of TOA, vaginal exam is reassuring, patient may have BV which we can treat her for, but there is no evidence of ongoing STD and her chlamydia has cleared. Could be that she's having a vomiting reaction to the doxycycline. This certainly could also be reflux disease. Her abdomen is  completely benign at this time. She is using her telephone in no acute distress. Extensive return precautions and follow-up given and understood. Patient tolerating by mouth at this time. She is requesting discharge.  Clinical Course   ____________________________________________   FINAL CLINICAL IMPRESSION(S) / ED DIAGNOSES  Final diagnoses:  Abdominal pain  Abdominal pain      This chart was dictated using voice recognition software.  Despite best efforts to proofread,  errors can occur which can change meaning.      Jeanmarie PlantJames A Sharley Keeler, MD 06/08/16 1932    Jeanmarie PlantJames A Kacper Cartlidge, MD 06/08/16 2159

## 2016-06-08 NOTE — ED Notes (Signed)
Pt discharged to home.  Family member driving.  Discharge instructions reviewed.  Verbalized understanding.  No questions or concerns at this time.  Teach back verified.  Pt in NAD.  No items left in ED.   

## 2016-06-08 NOTE — ED Triage Notes (Signed)
Pt arrived a&o via wheelchair by EMS.Reports mid abd pain.  Pt currently being treated for PID, taking doxy and oxycodone.

## 2016-06-10 LAB — URINE CULTURE

## 2018-04-15 ENCOUNTER — Emergency Department: Payer: Self-pay

## 2018-04-15 ENCOUNTER — Other Ambulatory Visit: Payer: Self-pay

## 2018-04-15 DIAGNOSIS — R1013 Epigastric pain: Secondary | ICD-10-CM | POA: Insufficient documentation

## 2018-04-15 DIAGNOSIS — J45909 Unspecified asthma, uncomplicated: Secondary | ICD-10-CM | POA: Insufficient documentation

## 2018-04-15 DIAGNOSIS — Z79899 Other long term (current) drug therapy: Secondary | ICD-10-CM | POA: Insufficient documentation

## 2018-04-15 DIAGNOSIS — R1011 Right upper quadrant pain: Secondary | ICD-10-CM | POA: Insufficient documentation

## 2018-04-15 LAB — BASIC METABOLIC PANEL
Anion gap: 7 (ref 5–15)
BUN: 9 mg/dL (ref 6–20)
CO2: 27 mmol/L (ref 22–32)
Calcium: 9.2 mg/dL (ref 8.9–10.3)
Chloride: 104 mmol/L (ref 98–111)
Creatinine, Ser: 0.71 mg/dL (ref 0.44–1.00)
GFR calc Af Amer: >60 mL/min (ref >60)
GLUCOSE: 106 mg/dL — AB (ref 70–99)
POTASSIUM: 3.3 mmol/L — AB (ref 3.5–5.1)
Sodium: 138 mmol/L (ref 135–145)

## 2018-04-15 LAB — CBC
HEMATOCRIT: 40.9 % (ref 35.0–47.0)
Hemoglobin: 13.8 g/dL (ref 12.0–16.0)
MCH: 29.3 pg (ref 26.0–34.0)
MCHC: 33.8 g/dL (ref 32.0–36.0)
MCV: 86.8 fL (ref 80.0–100.0)
Platelets: 362 10*3/uL (ref 150–440)
RBC: 4.71 MIL/uL (ref 3.80–5.20)
RDW: 14.1 % (ref 11.5–14.5)
WBC: 9.5 10*3/uL (ref 3.6–11.0)

## 2018-04-15 LAB — TROPONIN I: Troponin I: <0.03 ng/mL (ref <0.03)

## 2018-04-15 NOTE — ED Triage Notes (Signed)
First Nurse Note:  C/O right lower rib pain and right scapular pain x 2 days.  Pain worse with movement, cough and deep breathing.  No SOB/ DOE.  NAD

## 2018-04-15 NOTE — ED Triage Notes (Addendum)
Pt arrives to ED via POV from home with c/o centralized CP with radiation into the upper back x2 days. Pt reports (+) SHOB, no N/V/D, fever, or diaphoresis. Pt is a non-smoker.

## 2018-04-16 ENCOUNTER — Emergency Department: Payer: Self-pay

## 2018-04-16 ENCOUNTER — Emergency Department
Admission: EM | Admit: 2018-04-16 | Discharge: 2018-04-16 | Disposition: A | Discharge status: Home / Self Care | Payer: Self-pay | Attending: Emergency Medicine | Admitting: Emergency Medicine

## 2018-04-16 ENCOUNTER — Other Ambulatory Visit: Payer: Self-pay

## 2018-04-16 DIAGNOSIS — R1013 Epigastric pain: Secondary | ICD-10-CM

## 2018-04-16 DIAGNOSIS — R1011 Right upper quadrant pain: Secondary | ICD-10-CM

## 2018-04-16 LAB — LIPASE, BLOOD: LIPASE: 27 U/L (ref 11–51)

## 2018-04-16 LAB — HEPATIC FUNCTION PANEL
ALBUMIN: 4.2 g/dL (ref 3.5–5.0)
ALT: 15 U/L (ref 0–44)
AST: 17 U/L (ref 15–41)
Alkaline Phosphatase: 71 U/L (ref 38–126)
Bilirubin, Direct: <0.1 mg/dL (ref 0.0–0.2)
TOTAL PROTEIN: 7.6 g/dL (ref 6.5–8.1)
Total Bilirubin: 0.2 mg/dL — ABNORMAL LOW (ref 0.3–1.2)

## 2018-04-16 MED ORDER — GI COCKTAIL ~~LOC~~
30.0000 mL | Freq: Once | ORAL | Status: AC
  Administered 2018-04-16: 30 mL via ORAL
  Filled 2018-04-16: qty 30

## 2018-04-16 MED ORDER — FAMOTIDINE 40 MG PO TABS: 40.0000 mg | ORAL_TABLET | Freq: Every evening | ORAL | 0 refills | Status: DC

## 2018-04-16 MED ORDER — ONDANSETRON HCL 4 MG/2ML IJ SOLN
4.0000 mg | Freq: Once | INTRAMUSCULAR | Status: AC
  Administered 2018-04-16: 4 mg via INTRAVENOUS
  Filled 2018-04-16: qty 2

## 2018-04-16 MED ORDER — MORPHINE SULFATE (PF) 4 MG/ML IV SOLN
4.0000 mg | Freq: Once | INTRAVENOUS | Status: AC
  Administered 2018-04-16: 4 mg via INTRAVENOUS
  Filled 2018-04-16: qty 1

## 2018-04-16 NOTE — ED Notes (Signed)
Pt is going to ultrasound.   

## 2018-04-16 NOTE — Discharge Instructions (Addendum)
Please follow up with the acute care clinic °

## 2018-04-16 NOTE — ED Notes (Signed)
Pt is back from ultrasound.

## 2018-04-16 NOTE — ED Provider Notes (Signed)
[facility], [facility] Emergency Department Provider Note   ____________________________________________   First MD Initiated Contact with Patient 04/16/18 0120     (approximate)  I have reviewed the triage vital signs and the nursing notes.   HISTORY  Chief Complaint Chest Pain and Back Pain    HPI Deanna Soto is a 22 y.o. female who comes into the hospital today with some epigastric and right upper quadrant pain that radiates up to her right shoulder.  The patient states that her symptoms started about 24 hours ago.  The patient was laying in bed when the symptoms started.  She states that she took some ibuprofen for the pain but it has not helped.  The patient rates her pain a 7 out of 10 in intensity.  She is never had these symptoms before.  She denies any fevers, nausea, vomiting, dizziness, lightheadedness.  The patient also denies any shortness of breath.  She is here today for evaluation of her symptoms.   Past Medical History:  Diagnosis Date  . Asthma    as a child  . Herpes genitalia     There are no active problems to display for this patient.   History reviewed. No pertinent surgical history.  Prior to Admission medications   Medication Sig Start Date End Date Taking? Authorizing Provider  doxycycline (ADOXA) 100 MG tablet Take 1 tablet (100 mg total) by mouth 2 (two) times daily. 05/31/16   Jennye MoccasinQuigley, Brian S, MD  erythromycin ophthalmic ointment Place 1 application into both eyes 4 (four) times daily. For seven days 03/10/16   Renford DillsMiller, Lindsey, NP  famotidine (PEPCID) 20 MG tablet Take 1 tablet (20 mg total) by mouth 2 (two) times daily. 06/08/16 06/08/17  Jeanmarie PlantMcShane, James A, MD  famotidine (PEPCID) 40 MG tablet Take 1 tablet (40 mg total) by mouth every evening. 04/16/18 04/16/19  Rebecka ApleyWebster, Yeraldine Forney P, MD  gentamicin (GARAMYCIN) 0.3 % ophthalmic solution Place 1 drop into both eyes every 4 (four) hours. 03/05/16   Joni ReiningSmith, Ronald K, PA-C    HYDROcodone-acetaminophen (NORCO) 5-325 MG tablet Take 1 tablet by mouth every 6 (six) hours as needed for moderate pain. 05/31/16   Jennye MoccasinQuigley, Brian S, MD  metroNIDAZOLE (FLAGYL) 500 MG tablet Take 1 tablet (500 mg total) by mouth 2 (two) times daily. 06/08/16   Jeanmarie PlantMcShane, James A, MD  ondansetron (ZOFRAN) 4 MG tablet Take 1 tablet (4 mg total) by mouth every 8 (eight) hours as needed for nausea or vomiting. 06/08/16   Jeanmarie PlantMcShane, James A, MD    Allergies Patient has no known allergies.  No family history on file.  Social History Social History   Tobacco Use  . Smoking status: Never Smoker  . Smokeless tobacco: Never Used  Substance Use Topics  . Alcohol use: No  . Drug use: Not on file    Review of Systems  Constitutional: No fever/chills Eyes: No visual changes. ENT: No sore throat. Cardiovascular: Denies chest pain. Respiratory: Denies shortness of breath. Gastrointestinal:  abdominal pain.  No nausea, no vomiting.  Genitourinary: Negative for dysuria. Musculoskeletal: Negative for back pain. Skin: Negative for rash. Neurological: Negative for headaches,   ____________________________________________   PHYSICAL EXAM:  VITAL SIGNS: ED Triage Vitals  Enc Vitals Group     BP 04/15/18 2156 129/71     Pulse Rate 04/15/18 2156 90     Resp 04/15/18 2156 17     Temp 04/15/18 2156 98.7 F (37.1 C)     Temp Source  04/15/18 2156 Oral     SpO2 04/15/18 2156 100 %     Weight 04/15/18 2157 190 lb (86.2 kg)     Height 04/15/18 2157 5\' 8"  (1.727 m)     Head Circumference --      Peak Flow --      Pain Score 04/15/18 2157 7     Pain Loc --      Pain Edu? --      Excl. in GC? --     Constitutional: Alert and oriented. Well appearing and in moderate distress. Eyes: Conjunctivae are normal. PERRL. EOMI. Head: Atraumatic. Nose: No congestion/rhinnorhea. Mouth/Throat: Mucous membranes are moist.  Oropharynx non-erythematous. Cardiovascular: Normal rate, regular rhythm.  Grossly normal heart sounds.  Good peripheral circulation. Respiratory: Normal respiratory effort.  No retractions. Lungs CTAB. Gastrointestinal: Soft with some right upper quadrant and epigastric tenderness to palpation. No distention.  Positive bowel sounds Musculoskeletal: No lower extremity tenderness nor edema.   Neurologic:  Normal speech and language.  Skin:  Skin is warm, dry and intact.  Psychiatric: Mood and affect are normal.   ____________________________________________   LABS (all labs ordered are listed, but only abnormal results are displayed)  Labs Reviewed  BASIC METABOLIC PANEL - Abnormal; Notable for the following components:      Result Value   Potassium 3.3 (*)    Glucose, Bld 106 (*)    All other components within normal limits  HEPATIC FUNCTION PANEL - Abnormal; Notable for the following components:   Total Bilirubin 0.2 (*)    All other components within normal limits  CBC  TROPONIN I  LIPASE, BLOOD  POC URINE PREG, ED   ____________________________________________  EKG  ED ECG REPORT I, Rebecka Apley, the attending physician, personally viewed and interpreted this ECG.   Date: 04/15/2018  EKG Time: 2159  Rate: 95  Rhythm: normal sinus rhythm, with sinus arrythmia  Axis: normal  Intervals:none  ST&T Change: none  ____________________________________________  RADIOLOGY  ED MD interpretation:  CXR: No active cardiopulmonary disease  [place] abd and pelvis: Negative right upper quadrant abdominal ultrasound  Official radiology report(s): Dg Chest 2 View  Result Date: 04/15/2018 CLINICAL DATA:  Central chest pain radiating to the upper back x2 days. EXAM: CHEST - 2 VIEW COMPARISON:  None. FINDINGS: The heart size and mediastinal contours are within normal limits. Both lungs are clear. The visualized skeletal structures are unremarkable. IMPRESSION: No active cardiopulmonary disease. Electronically Signed   By: Tollie Eth M.D.   On:  04/15/2018 22:21   US Abdomen Limited Ruq  Result Date: 04/16/2018 CLINICAL DATA:  Epigastric pain for 2 days EXAM: ULTRASOUND ABDOMEN LIMITED RIGHT UPPER QUADRANT COMPARISON:  CT 05/31/2016, ultrasound 06/08/2016 FINDINGS: Gallbladder: No gallstones or wall thickening visualized. No sonographic Murphy sign noted by sonographer. Common bile duct: Diameter: 3.1 mm Liver: No focal lesion identified. Within normal limits in parenchymal echogenicity. Portal vein is patent on color Doppler imaging with normal direction of blood flow towards the liver. IMPRESSION: Negative right upper quadrant abdominal ultrasound Electronically Signed   By: Jasmine Pang M.D.   On: 04/16/2018 03:23    ____________________________________________   PROCEDURES  Procedure(s) performed: None  Procedures  Critical Care performed: No  ____________________________________________   INITIAL IMPRESSION / ASSESSMENT AND PLAN / ED COURSE  As part of my medical decision making, I reviewed the following data within the electronic MEDICAL RECORD NUMBER Notes from prior ED visits and [place] Controlled Substance Database   This  is a 22 year old female who comes into the hospital today with some epigastric and right upper quadrant abdominal pain.  The patient states that the pain is radiating up to her right shoulder.  My concern is for possible biliary disease versus pancreatitis.  The patient's initial blood work which included a CBC troponin and a BMP were unremarkable.  I did add on a hepatic function panel and a lipase.  I will send the patient for an ultrasound and give her a dose of morphine and Zofran.  She will be reassessed.  The patient's ultrasound and blood work is unremarkable.  She will be discharged home and encouraged to follow-up with the acute care clinic.  I will give the patient a GI cocktail as she did have some epigastric pain.  She should return with any worsening symptoms or any other concerns.       ____________________________________________   FINAL CLINICAL IMPRESSION(S) / ED DIAGNOSES  Final diagnoses:  Epigastric pain  Right upper quadrant abdominal pain     ED Discharge Orders         Ordered    famotidine (PEPCID) 40 MG tablet  Every evening,   Status:  Discontinued     04/16/18 0440    famotidine (PEPCID) 40 MG tablet  Every evening     04/16/18 0441           Note:  This document was prepared using Dragon voice recognition software and may include unintentional dictation errors.    Rebecka Apley, MD 04/16/18 [phone]

## 2019-12-26 ENCOUNTER — Emergency Department
Admission: EM | Admit: 2019-12-26 | Discharge: 2019-12-26 | Disposition: A | Discharge status: Home / Self Care | Payer: Self-pay | Attending: Emergency Medicine | Admitting: Emergency Medicine

## 2019-12-26 ENCOUNTER — Other Ambulatory Visit: Payer: Self-pay

## 2019-12-26 ENCOUNTER — Emergency Department: Payer: Self-pay

## 2019-12-26 ENCOUNTER — Encounter: Payer: Self-pay | Admitting: *Deleted

## 2019-12-26 DIAGNOSIS — S0990XA Unspecified injury of head, initial encounter: Secondary | ICD-10-CM

## 2019-12-26 DIAGNOSIS — Y929 Unspecified place or not applicable: Secondary | ICD-10-CM | POA: Insufficient documentation

## 2019-12-26 DIAGNOSIS — S0083XA Contusion of other part of head, initial encounter: Secondary | ICD-10-CM

## 2019-12-26 DIAGNOSIS — S060X0A Concussion without loss of consciousness, initial encounter: Secondary | ICD-10-CM

## 2019-12-26 DIAGNOSIS — Y939 Activity, unspecified: Secondary | ICD-10-CM | POA: Insufficient documentation

## 2019-12-26 DIAGNOSIS — J45909 Unspecified asthma, uncomplicated: Secondary | ICD-10-CM | POA: Insufficient documentation

## 2019-12-26 DIAGNOSIS — Y999 Unspecified external cause status: Secondary | ICD-10-CM | POA: Insufficient documentation

## 2019-12-26 DIAGNOSIS — Z79899 Other long term (current) drug therapy: Secondary | ICD-10-CM | POA: Insufficient documentation

## 2019-12-26 MED ORDER — HYDROCODONE-ACETAMINOPHEN 5-325 MG PO TABS: 1.0000 | ORAL_TABLET | ORAL | 0 refills | Status: DC | PRN

## 2019-12-26 MED ORDER — OXYCODONE-ACETAMINOPHEN 5-325 MG PO TABS
1.0000 | ORAL_TABLET | Freq: Once | ORAL | Status: AC
  Administered 2019-12-26: 1 via ORAL
  Filled 2019-12-26: qty 1

## 2019-12-26 MED ORDER — ONDANSETRON 8 MG PO TBDP
8.0000 mg | ORAL_TABLET | Freq: Once | ORAL | Status: AC
  Administered 2019-12-26: 22:00:00 8 mg via ORAL
  Filled 2019-12-26: qty 1

## 2019-12-26 MED ORDER — MELOXICAM 15 MG PO TABS: 15.0000 mg | ORAL_TABLET | Freq: Every day | ORAL | 0 refills | Status: DC

## 2019-12-26 MED ORDER — MELOXICAM 7.5 MG PO TABS
15.0000 mg | ORAL_TABLET | Freq: Once | ORAL | Status: AC
  Administered 2019-12-26: 22:00:00 15 mg via ORAL
  Filled 2019-12-26: qty 2
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
## 2019-12-26 NOTE — Discharge Instructions (Signed)
Use an app on your phone called goodRx.  Look up the medications on the app, it will provide you a coupon that you can show to the pharmacist to act as insurance when picking up your medication.

## 2019-12-26 NOTE — ED Notes (Signed)
Pt has a superficial cut below right side of lower lip   Bleeding controlled.  No neck or back pain.  Pt has a headache.  Pt alert  Speech clear.
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
## 2022-03-20 ENCOUNTER — Ambulatory Visit
Admission: EM | Admit: 2022-03-20 | Discharge: 2022-03-20 | Disposition: A | Discharge status: Home / Self Care | Payer: Self-pay | Attending: Emergency Medicine | Admitting: Emergency Medicine

## 2022-03-20 DIAGNOSIS — Z3201 Encounter for pregnancy test, result positive: Secondary | ICD-10-CM | POA: Insufficient documentation

## 2022-03-20 LAB — HCG, QUANTITATIVE, PREGNANCY: hCG, Beta Chain, Quant, S: 54099 m[IU]/mL — ABNORMAL HIGH (ref <5)

## 2022-03-20 LAB — PREGNANCY, URINE: Preg Test, Ur: POSITIVE — AB

## 2022-03-20 MED ORDER — DOXYLAMINE-PYRIDOXINE 10-10 MG PO TBEC: 2.0000 | DELAYED_RELEASE_TABLET | Freq: Every evening | ORAL | 2 refills | Status: DC

## 2022-03-20 NOTE — ED Provider Notes (Signed)
[facility]    CSN: [id number] Arrival date & time: 03/20/22  5809      History   Chief Complaint Chief Complaint  Patient presents with   Nausea   Headache    HPI Deanna Soto is a 26 y.o. female.   HPI  26 year old female here for evaluation of multiple complaints.  Patient reports that she has been experiencing nausea for the past 2 weeks with intermittent vomiting.  She has had a frontal headache for the last 1.5 weeks.  She states that she can eat but it causes nausea.  She has been able to drink water and ginger ale.  She is not in any acute distress.  She denies runny nose, nasal congestion, ear pain, sore throat, cough, abdominal pain, urinary complaints, vaginal discharge, or vaginal itching.  She had an Implanon placed in 2011, 2014.  She has not had a replaced and she is not on any other form of birth control.  She is having unprotected sex.  Her last menstrual period was on 01/22/2022.  She does endorse tenderness around her nipples.  Is also [place] wearing glasses, but she does not.  She does endorse eyestrain from looking at a computer in her job.  She has not had a recent eye exam.  Past Medical History:  Diagnosis Date   Asthma    as a child   Herpes genitalia     There are no problems to display for this patient.   History reviewed. No pertinent surgical history.  OB History   No obstetric history on file.      Home Medications    Prior to Admission medications   Medication Sig Start Date End Date Taking? Authorizing Provider  Doxylamine-Pyridoxine 10-10 MG TBEC Take 2 tablets by mouth at bedtime. Take 2 tablets at bedtime on days 1 and 2.  If nausea persists take 1 tablet in the morning on day 3 and 2 tablets at bedtime.  If symptoms still continue you may take 1 tablet in the morning, 1 tablet mid afternoon, and 2 tablets at bedtime on day 4. 03/20/22  Yes Becky Augusta, NP    Family History History reviewed. No pertinent family  history.  Social History Social History   Tobacco Use   Smoking status: Never   Smokeless tobacco: Never  Substance Use Topics   Alcohol use: No     Allergies   Patient has no known allergies.   Review of Systems Review of Systems  Constitutional:  Negative for fever.  HENT:  Negative for congestion, ear pain, rhinorrhea, sinus pressure and sore throat.   Respiratory:  Negative for cough.   Gastrointestinal:  Positive for nausea and vomiting. Negative for abdominal pain, blood in stool and diarrhea.  Endocrine:       Breast tenderness  Genitourinary:  Negative for dysuria, frequency, urgency, vaginal discharge and vaginal pain.  Neurological:  Positive for headaches.     Physical Exam Triage Vital Signs ED Triage Vitals  Enc Vitals Group     BP 03/20/22 0938 121/75     Pulse Rate 03/20/22 0938 81     Resp 03/20/22 0938 18     Temp 03/20/22 0938 98.9 F (37.2 C)     Temp Source 03/20/22 0938 Oral     SpO2 03/20/22 0938 100 %     Weight --      Height --      Head Circumference --      Peak  Flow --      Pain Score 03/20/22 0935 0     Pain Loc --      Pain Edu? --      Excl. in GC? --    No data found.  Updated Vital Signs BP 121/75 (BP Location: Left Arm)   Pulse 81   Temp 98.9 F (37.2 C) (Oral)   Resp 18   LMP 01/22/2022   SpO2 100%   Visual Acuity Right Eye Distance:   Left Eye Distance:   Bilateral Distance:    Right Eye Near:   Left Eye Near:    Bilateral Near:     Physical Exam Vitals and nursing note reviewed.  Constitutional:      Appearance: Normal appearance. She is not ill-appearing.  HENT:     Head: Normocephalic and atraumatic.     Right Ear: Tympanic membrane, ear canal and external ear normal. There is no impacted cerumen.     Left Ear: Tympanic membrane, ear canal and external ear normal. There is no impacted cerumen.     Nose: Congestion and rhinorrhea present.     Mouth/Throat:     Mouth: Mucous membranes are moist.      Pharynx: Oropharynx is clear. No oropharyngeal exudate or posterior oropharyngeal erythema.  Eyes:     General: No scleral icterus.    Extraocular Movements: Extraocular movements intact.     Conjunctiva/sclera: Conjunctivae normal.     Pupils: Pupils are equal, round, and reactive to light.  Cardiovascular:     Rate and Rhythm: Normal rate and regular rhythm.     Pulses: Normal pulses.     Heart sounds: Normal heart sounds. No murmur heard.    No friction rub. No gallop.  Pulmonary:     Effort: Pulmonary effort is normal.     Breath sounds: Normal breath sounds. No wheezing, rhonchi or rales.  Musculoskeletal:     Cervical back: Normal range of motion.  Lymphadenopathy:     Cervical: No cervical adenopathy.  Skin:    General: Skin is warm and dry.     Capillary Refill: Capillary refill takes less than 2 seconds.     Findings: No erythema or rash.  Neurological:     General: No focal deficit present.     Mental Status: She is alert and oriented to person, place, and time.  Psychiatric:        Mood and Affect: Mood normal.        Behavior: Behavior normal.        Thought Content: Thought content normal.        Judgment: Judgment normal.      UC Treatments / Results  Labs (all labs ordered are listed, but only abnormal results are displayed) Labs Reviewed  PREGNANCY, URINE - Abnormal; Notable for the following components:      Result Value   Preg Test, Ur POSITIVE (*)    All other components within normal limits  HCG, QUANTITATIVE, PREGNANCY    EKG   Radiology No results found.  Procedures Procedures (including critical care time)  Medications Ordered in UC Medications - No data to display  Initial Impression / Assessment and Plan / UC Course  I have reviewed the triage vital signs and the nursing notes.  Pertinent labs & imaging results that were available during my care of the patient were reviewed by me and considered in my medical decision making (see  chart for details).  Patient is a very pleasant,  nontoxic-appearing 26 year old female who is in no acute distress presenting for 2 weeks worth of nausea, intermittent vomiting, and a week and half worth of frontal headache.  She denies any upper or lower respiratory symptoms, abdominal pain, diarrhea, urinary symptoms, or vaginal complaints.  She has an Implanon in her left arm that was placed in 2011 and was due to come out in 2014.  It has not come out.  She is not on any other forms of birth control and she is having unprotected sex.  Her last period was on 01/22/2022.  She does endorse nipple tenderness.  Patient also is supposed be wearing glasses but she does not.  She had to prescribe when she was younger but she is never worn them and never been back to the eye doctor.  Physical exam reveals pearly-gray tympanic membranes bilaterally with normal light reflex and clear external auditory canals.  Pupils are equal round reactive and EOMs intact.  Conjunctivae are normal.  Nasal mucosa is erythematous edematous with clear discharge in both nares.  There is no tenderness to percussion of frontal or maxillary sinuses.  Oropharyngeal exam is benign.  No cervical lymphadenopathy appreciable exam.  Cardiopulmonary exam reveals S1-S2 heart sounds with regular rate and rhythm and lung sounds that are clear auscultation all fields.  Will check urine pregnancy.  Urine pregnancy test is positive.  I will order beta-hCG.  I have advised the patient that she has a positive pregnancy test.  She states that she does not have an OB/GYN right now so I will refer her to [facility] in [place].  She can take Tylenol as needed for her headache and I will prescribe Diclegis for her for use with nausea.   Final Clinical Impressions(s) / UC Diagnoses   Final diagnoses:  Pregnancy confirmed by positive urine test     Discharge Instructions      Your urine pregnancy test was negative and we have ordered a blood  test to determine exactly how far along you are.  Use over-the-counter Tylenol as needed for your headache.  Do not take Aleve, ibuprofen, or aspirin.  I am prescribing Diclegis that she can use for nausea.  Doxylamine 10 mg/pyridoxine 10 mg (Diclegis): Oral: Initial: Two tablets at bedtime on day 1 and 2; if symptoms persist, take 1 tablet in morning and 2 tablets at bedtime on day 3; if symptoms persist, may increase to 1 tablet in morning, 1 tablet mid-afternoon, and 2 tablets at bedtime on day 4 (maximum: doxylamine 40 mg/pyridoxine 40 mg (4 tablets) per day)    You can also use ginger ale and candy ginger as needed for nausea.  I have also referred you to [facility] in [place] to manage your pregnancy.  They will contact you for an appointment.  If you develop worsening headache, nausea or vomiting and cannot keep down fluids, or any vaginal bleeding please go to the ER for evaluation.     ED Prescriptions     Medication Sig Dispense Auth. Provider   Doxylamine-Pyridoxine 10-10 MG TBEC Take 2 tablets by mouth at bedtime. Take 2 tablets at bedtime on days 1 and 2.  If nausea persists take 1 tablet in the morning on day 3 and 2 tablets at bedtime.  If symptoms still continue you may take 1 tablet in the morning, 1 tablet mid afternoon, and 2 tablets at bedtime on day 4. 60 tablet Becky Augusta, NP      PDMP not reviewed this encounter.  Becky Augusta, NP 03/20/22 1023

## 2022-03-20 NOTE — ED Triage Notes (Addendum)
Pt present headache with nausea and vomiting. Symptoms started two weeks  ago. Pt has the Implanon in her left arm it was due to come out three years ago.

## 2022-03-20 NOTE — Discharge Instructions (Addendum)
Your urine pregnancy test was negative and we have ordered a blood test to determine exactly how far along you are.  Use over-the-counter Tylenol as needed for your headache.  Do not take Aleve, ibuprofen, or aspirin.  I am prescribing Diclegis that she can use for nausea.  Doxylamine 10 mg/pyridoxine 10 mg (Diclegis): Oral: Initial: Two tablets at bedtime on day 1 and 2; if symptoms persist, take 1 tablet in morning and 2 tablets at bedtime on day 3; if symptoms persist, may increase to 1 tablet in morning, 1 tablet mid-afternoon, and 2 tablets at bedtime on day 4 (maximum: doxylamine 40 mg/pyridoxine 40 mg (4 tablets) per day)    You can also use ginger ale and candy ginger as needed for nausea.  I have also referred you to [facility] in [place] to manage your pregnancy.  They will contact you for an appointment.  If you develop worsening headache, nausea or vomiting and cannot keep down fluids, or any vaginal bleeding please go to the ER for evaluation.

## 2022-03-30 ENCOUNTER — Ambulatory Visit (INDEPENDENT_AMBULATORY_CARE_PROVIDER_SITE_OTHER): Payer: Self-pay

## 2022-03-30 VITALS — Wt 190.0 lb

## 2022-03-30 DIAGNOSIS — Z369 Encounter for antenatal screening, unspecified: Secondary | ICD-10-CM

## 2022-03-30 DIAGNOSIS — Z3480 Encounter for supervision of other normal pregnancy, unspecified trimester: Secondary | ICD-10-CM

## 2022-03-30 DIAGNOSIS — Z3A00 Weeks of gestation of pregnancy not specified: Secondary | ICD-10-CM

## 2022-03-30 NOTE — Progress Notes (Signed)
New OB Intake  I connected with  Deanna Soto on 03/30/22 at  2:15 PM EDT by telephone and verified that I am speaking with the correct person using two identifiers. Nurse is located at [facility] and pt is located at works from home.  I explained I am completing New OB Intake today. We discussed her EDD of 10/29/2022 that is based on LMP of 01/22/2022. Pt is G2/P1001. I reviewed her allergies, medications, Medical/Surgical/OB history, and appropriate screenings. Based on history, this is a/an pregnancy uncomplicated .   Patient Active Problem List   Diagnosis Date Noted   Supervision of other normal pregnancy, antepartum 03/30/2022    Concerns addressed today None.  However, pt has implanon that has been in since 2011.  Delivery Plans:  Plans to deliver at [facility].  Anatomy US Explained first scheduled [place] will be around 20 weeks.   Labs Discussed genetic screening with patient. Patient declines genetic testing at this time. Discussed possible labs to be drawn at new OB appointment.  COVID Vaccine Patient has not had COVID vaccine.   Social Determinants of Health Food Insecurity: denies food insecurity WIC Referral: Patient is interested in referral to [facility].  States she does get help. Transportation: Patient expressed transportation needs. Pt does not drive. Childcare: Discussed no children allowed at ultrasound appointments.   First visit review I reviewed new OB appt with pt. I explained she will have ob bloodwork and pap smear/pelvic exam if indicated. Explained pt will be seen by Dr. Nicholaus Bloom at first visit; encounter routed to appropriate provider.   Serria Sloma, [facility] 03/30/2022  2:39 PM  Clinical Staff Provider  Office Location  [facility] Dating    Language  English Anatomy US    Flu Vaccine  offer Genetic Screen  NIPS:   TDaP vaccine   offer Hgb A1C or  GTT Early : Third trimester :   Covid declines   LAB RESULTS   Rhogam   Blood  Type     Feeding Plan formula Antibody    Contraception undecided Rubella    Circumcision yes RPR     Pediatrician  [facility]     Support Person Jason Nest HIV    Prenatal Classes no Varicella     GBS  (For PCN allergy, check sensitivities)   BTL Consent  Hep C   VBAC Consent  Pap      Hgb Electro      CF      SMA

## 2022-04-06 ENCOUNTER — Other Ambulatory Visit: Payer: Self-pay

## 2022-04-06 DIAGNOSIS — Z369 Encounter for antenatal screening, unspecified: Secondary | ICD-10-CM

## 2022-04-06 DIAGNOSIS — Z3480 Encounter for supervision of other normal pregnancy, unspecified trimester: Secondary | ICD-10-CM

## 2022-04-07 LAB — CBC/D/PLT+RPR+RH+ABO+RUBIGG...
Antibody Screen: NEGATIVE
Basophils Absolute: 0 10*3/uL (ref 0.0–0.2)
Basos: 0 %
EOS (ABSOLUTE): 0.1 10*3/uL (ref 0.0–0.4)
Eos: 1 %
HCV Ab: NONREACTIVE
HIV Screen 4th Generation wRfx: NONREACTIVE
Hematocrit: 37.5 % (ref 34.0–46.6)
Hemoglobin: 12.5 g/dL (ref 11.1–15.9)
Hepatitis B Surface Ag: NEGATIVE
Immature Grans (Abs): 0 10*3/uL (ref 0.0–0.1)
Immature Granulocytes: 0 %
Lymphocytes Absolute: 1.1 10*3/uL (ref 0.7–3.1)
Lymphs: 11 %
MCH: 28.6 pg (ref 26.6–33.0)
MCHC: 33.3 g/dL (ref 31.5–35.7)
MCV: 86 fL (ref 79–97)
Monocytes Absolute: 0.4 10*3/uL (ref 0.1–0.9)
Monocytes: 4 %
Neutrophils Absolute: 7.7 10*3/uL — ABNORMAL HIGH (ref 1.4–7.0)
Neutrophils: 84 %
Platelets: 233 10*3/uL (ref 150–450)
RBC: 4.37 x10E6/uL (ref 3.77–5.28)
RDW: 12.8 % (ref 11.7–15.4)
RPR Ser Ql: NONREACTIVE
Rh Factor: POSITIVE
Rubella Antibodies, IGG: 15.1 index (ref >0.99)
Varicella zoster IgG: 2213 index (ref >165)
WBC: 9.3 10*3/uL (ref 3.4–10.8)

## 2022-04-07 LAB — HCV INTERPRETATION

## 2022-04-14 ENCOUNTER — Telehealth: Payer: Self-pay | Admitting: Obstetrics & Gynecology

## 2022-04-14 ENCOUNTER — Encounter: Payer: Self-pay | Admitting: Obstetrics & Gynecology

## 2022-04-14 NOTE — Telephone Encounter (Signed)
Contacted pt to reschedule the appt that the pt had scheduled for 8/22 with Dr. Marice Potter at 9:55.  Left message for pt to call back to reschedule.

## 2022-04-15 ENCOUNTER — Encounter: Payer: Self-pay | Admitting: Obstetrics & Gynecology

## 2022-04-15 NOTE — Telephone Encounter (Signed)
Contacted pt (2x) to reschedule NOB appt that was scheduled with Dr. Marice Potter on 8/22 at 9:15.  Left message for pt to call back to reschedule.  Will send a MyChart letter to the pt.

## 2022-04-21 NOTE — Telephone Encounter (Signed)
Contacted patient via phone. Left voicemail for patient to call back to rescheduled NOB physical.

## 2022-08-24 NOTE — L&D Delivery Note (Signed)
         Delivery Note   [name] is a 27 y.o. G2P2002 at [redacted]w[redacted]d Estimated Date of Delivery: 10/29/22  PRE-OPERATIVE DIAGNOSIS:  1) [redacted]w[redacted]d pregnancy. No prenatal Care   POST-OPERATIVE DIAGNOSIS:  1) [redacted]w[redacted]d pregnancy s/p Vaginal, Spontaneous , No prenatal Care   Delivery Type: Vaginal, Spontaneous    Delivery Anesthesia: None   Labor Complications:  none    ESTIMATED BLOOD LOSS: 150  ml    FINDINGS:   1) female infant, Apgar scores of 9   at 1 minute and 9   at 5 minutes and a birthweight pending infant skin to skin    2) Nuchal cord: no  SPECIMENS:   PLACENTA:   Appearance: Intact , 3 vessel cord, cord blood sample collected    Removal: Spontaneous      Disposition:  per protocol   DISPOSITION:  Infant to left in stable condition in the delivery room, with L&D personnel and mother,  NARRATIVE SUMMARY: Labor course:  Ms. Deanna Soto is a [id number] at [redacted]w[redacted]d who presented for labor management.  She progressed well in labor without pitocin.  She received no anesthesia and proceeded to complete dilation. She evidenced good maternal expulsive effort during the second stage. She went on to deliver a viable female infant. The placenta delivered without problems and was noted to be complete. A perineal and vaginal examination was performed. Episiotomy/Lacerations: None   The patient tolerated this well.  Philip Aspen, CNM  10/30/2022 9:06 AM

## 2022-10-30 ENCOUNTER — Other Ambulatory Visit: Payer: Self-pay

## 2022-10-30 ENCOUNTER — Inpatient Hospital Stay
Admission: EM | Admit: 2022-10-30 | Discharge: 2022-10-31 | DRG: 806 | Disposition: A | Discharge status: Home / Self Care | Payer: Medicaid Other | Attending: Certified Nurse Midwife | Admitting: Certified Nurse Midwife

## 2022-10-30 ENCOUNTER — Encounter: Payer: Self-pay | Admitting: Obstetrics and Gynecology

## 2022-10-30 DIAGNOSIS — A6000 Herpesviral infection of urogenital system, unspecified: Secondary | ICD-10-CM | POA: Diagnosis present

## 2022-10-30 DIAGNOSIS — O0930 Supervision of pregnancy with insufficient antenatal care, unspecified trimester: Secondary | ICD-10-CM

## 2022-10-30 DIAGNOSIS — O480 Post-term pregnancy: Secondary | ICD-10-CM | POA: Diagnosis present

## 2022-10-30 DIAGNOSIS — O0933 Supervision of pregnancy with insufficient antenatal care, third trimester: Secondary | ICD-10-CM | POA: Diagnosis not present

## 2022-10-30 DIAGNOSIS — Z23 Encounter for immunization: Secondary | ICD-10-CM

## 2022-10-30 DIAGNOSIS — O99214 Obesity complicating childbirth: Secondary | ICD-10-CM | POA: Diagnosis present

## 2022-10-30 DIAGNOSIS — O9832 Other infections with a predominantly sexual mode of transmission complicating childbirth: Secondary | ICD-10-CM | POA: Diagnosis present

## 2022-10-30 DIAGNOSIS — Z3A40 40 weeks gestation of pregnancy: Secondary | ICD-10-CM

## 2022-10-30 DIAGNOSIS — O479 False labor, unspecified: Principal | ICD-10-CM | POA: Diagnosis present

## 2022-10-30 DIAGNOSIS — O26893 Other specified pregnancy related conditions, third trimester: Secondary | ICD-10-CM | POA: Diagnosis present

## 2022-10-30 LAB — CBC
HCT: 38.2 % (ref 36.0–46.0)
Hemoglobin: 12.4 g/dL (ref 12.0–15.0)
MCH: 25.5 pg — ABNORMAL LOW (ref 26.0–34.0)
MCHC: 32.5 g/dL (ref 30.0–36.0)
MCV: 78.6 fL — ABNORMAL LOW (ref 80.0–100.0)
Platelets: 371 10*3/uL (ref 150–400)
RBC: 4.86 MIL/uL (ref 3.87–5.11)
RDW: 14.8 % (ref 11.5–15.5)
WBC: 9.5 10*3/uL (ref 4.0–10.5)
nRBC: 0 % (ref 0.0–0.2)

## 2022-10-30 LAB — TYPE AND SCREEN
ABO/RH(D): O POS
Antibody Screen: NEGATIVE

## 2022-10-30 LAB — URINE DRUG SCREEN, QUALITATIVE (ARMC ONLY)
Amphetamines, Ur Screen: NOT DETECTED
Barbiturates, Ur Screen: NOT DETECTED
Benzodiazepine, Ur Scrn: NOT DETECTED
Cannabinoid 50 Ng, Ur ~~LOC~~: NOT DETECTED
Cocaine Metabolite,Ur ~~LOC~~: NOT DETECTED
MDMA (Ecstasy)Ur Screen: NOT DETECTED
Methadone Scn, Ur: NOT DETECTED
Opiate, Ur Screen: NOT DETECTED
Phencyclidine (PCP) Ur S: NOT DETECTED
Tricyclic, Ur Screen: NOT DETECTED

## 2022-10-30 LAB — CHLAMYDIA/NGC RT PCR (ARMC ONLY)
Chlamydia Tr: NOT DETECTED
N gonorrhoeae: NOT DETECTED

## 2022-10-30 LAB — GROUP B STREP BY PCR: Group B strep by PCR: NEGATIVE

## 2022-10-30 LAB — ABO/RH: ABO/RH(D): O POS

## 2022-10-30 LAB — RPR: RPR Ser Ql: NONREACTIVE

## 2022-10-30 LAB — HIV ANTIBODY (ROUTINE TESTING W REFLEX): HIV Screen 4th Generation wRfx: NONREACTIVE

## 2022-10-30 MED ORDER — OXYTOCIN BOLUS FROM INFUSION
333.0000 mL | Freq: Once | INTRAVENOUS | Status: AC
  Administered 2022-10-30: 333 mL via INTRAVENOUS

## 2022-10-30 MED ORDER — OXYTOCIN-SODIUM CHLORIDE 30-0.9 UT/500ML-% IV SOLN
2.5000 [IU]/h | INTRAVENOUS | Status: DC
  Filled 2022-10-30: qty 500

## 2022-10-30 MED ORDER — OXYCODONE-ACETAMINOPHEN 5-325 MG PO TABS: 1.0000 | ORAL_TABLET | ORAL | Status: DC | PRN

## 2022-10-30 MED ORDER — WITCH HAZEL-GLYCERIN EX PADS: 1.0000 | MEDICATED_PAD | CUTANEOUS | Status: DC | PRN

## 2022-10-30 MED ORDER — SOD CITRATE-CITRIC ACID 500-334 MG/5ML PO SOLN: 30.0000 mL | ORAL | Status: DC | PRN

## 2022-10-30 MED ORDER — LACTATED RINGERS IV SOLN: INTRAVENOUS | Status: DC

## 2022-10-30 MED ORDER — IBUPROFEN 600 MG PO TABS
600.0000 mg | ORAL_TABLET | Freq: Four times a day (QID) | ORAL | Status: DC
  Administered 2022-10-30 – 2022-10-31 (×4): 600 mg via ORAL
  Filled 2022-10-30 (×4): qty 1

## 2022-10-30 MED ORDER — ONDANSETRON HCL 4 MG PO TABS: 4.0000 mg | ORAL_TABLET | ORAL | Status: DC | PRN

## 2022-10-30 MED ORDER — METHYLERGONOVINE MALEATE 0.2 MG/ML IJ SOLN: 0.2000 mg | INTRAMUSCULAR | Status: DC | PRN

## 2022-10-30 MED ORDER — OXYCODONE-ACETAMINOPHEN 5-325 MG PO TABS: 2.0000 | ORAL_TABLET | ORAL | Status: DC | PRN

## 2022-10-30 MED ORDER — DIBUCAINE (PERIANAL) 1 % EX OINT: 1.0000 | TOPICAL_OINTMENT | CUTANEOUS | Status: DC | PRN

## 2022-10-30 MED ORDER — IBUPROFEN 600 MG PO TABS
600.0000 mg | ORAL_TABLET | Freq: Once | ORAL | Status: AC
  Administered 2022-10-30: 600 mg via ORAL
  Filled 2022-10-30: qty 1

## 2022-10-30 MED ORDER — ACETAMINOPHEN 325 MG PO TABS
650.0000 mg | ORAL_TABLET | ORAL | Status: DC | PRN
  Filled 2022-10-30 (×2): qty 2

## 2022-10-30 MED ORDER — DOCUSATE SODIUM 100 MG PO CAPS: 100.0000 mg | ORAL_CAPSULE | Freq: Two times a day (BID) | ORAL | Status: DC

## 2022-10-30 MED ORDER — LACTATED RINGERS IV SOLN: 500.0000 mL | INTRAVENOUS | Status: DC | PRN

## 2022-10-30 MED ORDER — OXYTOCIN 10 UNIT/ML IJ SOLN
INTRAMUSCULAR | Status: AC
  Filled 2022-10-30: qty 2

## 2022-10-30 MED ORDER — AMMONIA AROMATIC IN INHA
RESPIRATORY_TRACT | Status: AC
  Filled 2022-10-30: qty 10

## 2022-10-30 MED ORDER — PRENATAL MULTIVITAMIN CH
1.0000 | ORAL_TABLET | Freq: Every day | ORAL | Status: DC
  Filled 2022-10-30 (×2): qty 1

## 2022-10-30 MED ORDER — SENNOSIDES-DOCUSATE SODIUM 8.6-50 MG PO TABS
2.0000 | ORAL_TABLET | ORAL | Status: DC
  Administered 2022-10-30: 2 via ORAL
  Filled 2022-10-30: qty 2

## 2022-10-30 MED ORDER — ONDANSETRON HCL 4 MG/2ML IJ SOLN: 4.0000 mg | Freq: Four times a day (QID) | INTRAMUSCULAR | Status: DC | PRN

## 2022-10-30 MED ORDER — TETANUS-DIPHTH-ACELL PERTUSSIS 5-2.5-18.5 LF-MCG/0.5 IM SUSY
0.5000 mL | PREFILLED_SYRINGE | Freq: Once | INTRAMUSCULAR | Status: AC
  Administered 2022-10-31: 0.5 mL via INTRAMUSCULAR
  Filled 2022-10-30: qty 0.5

## 2022-10-30 MED ORDER — MISOPROSTOL 200 MCG PO TABS
ORAL_TABLET | ORAL | Status: AC
  Filled 2022-10-30: qty 4

## 2022-10-30 MED ORDER — FERROUS SULFATE 325 (65 FE) MG PO TABS
325.0000 mg | ORAL_TABLET | Freq: Every day | ORAL | Status: DC
  Filled 2022-10-30: qty 1

## 2022-10-30 MED ORDER — METHYLERGONOVINE MALEATE 0.2 MG PO TABS: 0.2000 mg | ORAL_TABLET | ORAL | Status: DC | PRN

## 2022-10-30 MED ORDER — COCONUT OIL OIL: 1.0000 | TOPICAL_OIL | Status: DC | PRN

## 2022-10-30 MED ORDER — BENZOCAINE-MENTHOL 20-0.5 % EX AERO: 1.0000 | INHALATION_SPRAY | CUTANEOUS | Status: DC | PRN

## 2022-10-30 MED ORDER — SIMETHICONE 80 MG PO CHEW: 80.0000 mg | CHEWABLE_TABLET | ORAL | Status: DC | PRN

## 2022-10-30 MED ORDER — LIDOCAINE HCL (PF) 1 % IJ SOLN
30.0000 mL | INTRAMUSCULAR | Status: DC | PRN
  Filled 2022-10-30: qty 30

## 2022-10-30 MED ORDER — ONDANSETRON HCL 4 MG/2ML IJ SOLN: 4.0000 mg | INTRAMUSCULAR | Status: DC | PRN

## 2022-10-30 MED ORDER — BENZOCAINE-MENTHOL 20-0.5 % EX AERO
INHALATION_SPRAY | CUTANEOUS | Status: AC
  Filled 2022-10-30: qty 56

## 2022-10-30 NOTE — Lactation Note (Signed)
This note was copied from a baby's chart. Lactation Consultation Note  Patient Name: Deanna Soto [id number] Date: 10/30/2022 Age:29 hours      Clarified with mom her feeding plan as it is listed mom wants to breastfeed and formula feed but has been only bottle feeding formula.  Per mom she wants to formula feed as this was her feeding plan for her baby. She reports she did not have intentions to breastfeed. Care nurse updated.   [name] 10/30/2022, 6:09 PM

## 2022-10-30 NOTE — Discharge Instructions (Signed)
Discharge Instructions:   Follow-up Appointment: Call and schedule an appointment in 6-weeks for a visit with Philip Aspen, CNM at [facility]   If there are any new medications, they have been ordered and will be available for pickup at the listed pharmacy on your way home from the hospital.   Call office if you have any of the following: headache, visual changes, fever >101.0 F, chills, shortness of breath, breast concerns, excessive vaginal bleeding, incision drainage or problems, leg pain or redness, depression or any other concerns. If you have vaginal discharge with an odor, let your doctor know.   It is normal to bleed for up to 6 weeks. You should not soak through more than 1 pad in 1 hour. If you have a blood clot larger than your fist with continued bleeding, call your doctor.   Activity: Do not lift > 10 lbs for 6 weeks (do not lift anything heavier than your baby). No intercourse, tampons, swimming pools, hot tubs, baths (only showers) for 6 weeks.  No driving for 1-2 weeks. Continue prenatal vitamin, especially if breastfeeding. Increase calories and fluids (water) while breastfeeding.   Your milk will come in, in the next couple of days (right now it is colostrum). You may have a slight fever when your milk comes in, but it should go away on its own.  If it does not, and rises above 101 F please call the doctor. You will also feel achy and your breasts will be firm. They will also start to leak. If you are breastfeeding, continue as you have been and you can pump/express milk for comfort.   If you have too much milk, your breasts can become engorged, which could lead to mastitis. This is an infection of the milk ducts. It can be very painful and you will need to notify your doctor to obtain a prescription for antibiotics. You can also treat it with a shower or hot/cold compress.   For concerns about your baby, please call your pediatrician.  For breastfeeding concerns, the  lactation consultant can be reached at [phone].   Postpartum blues (feelings of happy one minute and sad another minute) are normal for the first few weeks but if it gets worse let your doctor know.   Congratulations! We enjoyed caring for you and your new bundle of joy!

## 2022-10-30 NOTE — H&P (Signed)
Deanna Soto is a 27 y.o. female presenting via EMS for labor. She has had no prenatal care since one visit to gather her history at [facility]  last August. Per her LMP she is 40 weeks 1 day today. She began having contractions yesterday during the afternoon.She denies any leaking of fluid.She shares " I like to do things naturally; I don't believe in using medication" OB History     Gravida  2   Para  1   Term  1   Preterm      AB      Living  1      SAB      IAB      Ectopic      Multiple      Live Births  1          Past Medical History:  Diagnosis Date   Asthma    as a child   Herpes genitalia    History reviewed. No pertinent surgical history. Family History: family history includes Cancer in her maternal grandfather; Cancer (age of onset: 32) in her maternal grandmother; Gout in her paternal grandfather; Heart disease in her paternal grandmother. Social History:  reports that she has never smoked. She has never used smokeless tobacco. She reports that she does not drink alcohol and does not use drugs.     Maternal Diabetes: No testing this pregnancy Genetic Screening: Declined Maternal Ultrasounds/Referrals: Other:No care, no ultrasounds this pregnancy Fetal Ultrasounds or other Referrals:  None Maternal Substance Abuse:  denies- UDS ordered Significant Maternal Medications:  None Significant Maternal Lab Results:  Other: unknown GBS Number of Prenatal Visits:Less than or equal to 3 verified prenatal visits Other Comments:   no records for this pregnancy- opted not to get care  Review of Systems  Constitutional: Negative.   HENT: Negative.    Eyes: Negative.   Respiratory: Negative.    Cardiovascular: Negative.   Gastrointestinal:        Gravid abdomen  Endocrine: Negative.   Genitourinary: Negative.   Musculoskeletal: Negative.   Allergic/Immunologic: Negative.   Neurological: Negative.   Psychiatric/Behavioral: Negative.      History Dilation: 7 Effacement (%): 70 Station: -1 Exam by:: Martinique Guptill RN Blood pressure 133/76, pulse 74, temperature 98.3 F (36.8 C), temperature source Oral, resp. rate 20, last menstrual period 01/22/2022. Maternal Exam:  Uterine Assessment: Contraction strength is moderate.  Contraction frequency is regular.  Abdomen: Fetal presentation: vertex Introitus: Normal vulva. Normal vagina.  Perineum and vagaina, cervix carefully viewed with additional lighting- no herpes lesions noted Pelvis: adequate for delivery.   Cervix: Cervix evaluated by digital exam.     Physical Exam Constitutional:      Appearance: Normal appearance. She is obese.  HENT:     Head: Normocephalic and atraumatic.  Cardiovascular:     Rate and Rhythm: Normal rate and regular rhythm.     Pulses: Normal pulses.     Heart sounds: Normal heart sounds.  Pulmonary:     Effort: Pulmonary effort is normal.     Breath sounds: Normal breath sounds.  Abdominal:     Comments: Gravid abdomen  Genitourinary:    General: Normal vulva.  Musculoskeletal:        General: Normal range of motion.     Cervical back: Normal range of motion and neck supple.  Skin:    General: Skin is warm and dry.  Neurological:     General: No focal deficit present.  Mental Status: She is alert and oriented to person, place, and time.  Psychiatric:        Mood and Affect: Mood normal.        Behavior: Behavior normal.    Fetal heart tracing: 135 baseline, moderate variability, accels present, decels absent. Prenatal labs: ABO, Rh: O/Positive/-- (08/14 1514) Antibody: Negative (08/14 1514) Rubella: 15.10 (08/14 1514) RPR: Non Reactive (08/14 1514)  HBsAg: Negative (08/14 1514)  HIV: Non Reactive (08/14 1514)  GBS:    SVE: 7 cms/100%/-3 with large bulging bag. Assessment/Plan: 27 yo Gravida 2 Para 1 in active labor at term No prenatal care Hx of HSV- no suppressive therapy Category 1 FHTs  Unknown GBS  status  Admit for Labor Routine Admission orders: Type and Screen, RPR, CBC UDS HGBA1C IV of Lactated Ringers EFM Perineum carefully viewed with lamp, both externally and internally- no lesions noted  Anticipate SVD. Delivery table set up. Dr. Marcelline Mates made aware of this admission. Imagene Riches 10/30/2022, 6:38 AM

## 2022-10-30 NOTE — Discharge Summary (Signed)
Postpartum Discharge Summary  Date of Service updated: 10/31/2022     Patient Name: Deanna Soto DOB: Apr 03, 1996 MRN: [id number]  Date of admission: 10/30/2022 Delivery date:10/30/2022  Delivering provider: Philip Aspen  Date of discharge: 10/31/2022  Admitting diagnosis: Uterine contractions [O47.9] Intrauterine pregnancy: [redacted]w[redacted]d    Secondary diagnosis:  Principal Problem:   Uterine contractions Active Problems:   Postpartum care following vaginal delivery   Limited prenatal care  Additional problems: no prenatal care, hx HSV    Discharge diagnosis: Term Pregnancy Delivered                                              Post partum procedures: none Augmentation: AROM Complications: None  Hospital course: Onset of Labor With Vaginal Delivery      27y.o. yo [id number] admitted in Active Labor on 10/30/2022. Labor course was uncomplicated. Membrane Rupture Time/Date: 8:03 AM ,10/30/2022   Delivery Method:Vaginal, Spontaneous  Episiotomy: None  Lacerations:  None  See delivery note for details  Patient had a postpartum course complicated by NA.  She is ambulating, tolerating a regular diet, passing flatus, and urinating well.   Patient is discharged home in stable condition on 10/31/22.  Newborn Data: Birth date:10/30/2022  Birth time:8:13 AM  Gender:Female  Living status:Living  Apgars:9 ,9  Weight:3670 g   Magnesium Sulfate received: No BMZ received: No Rhophylac:No MMR:No T-DaP:Given postpartum Flu: No Transfusion:No  Physical exam  Vitals:   10/30/22 1646 10/30/22 1938 10/30/22 2309 10/31/22 0812  BP: 109/65 96/67 101/68 104/68  Pulse: 66 72 70 73  Resp: '18 18 18 18  '$ Temp: 98.4 F (36.9 C) 98.3 F (36.8 C) 98.4 F (36.9 C) 98 F (36.7 C)  TempSrc: Oral Oral Oral Oral  SpO2:  100% 100% 100%   General: alert, cooperative, and no distress Lochia: appropriate Uterine Fundus: firm Incision: N/A DVT Evaluation: No evidence of DVT seen on  physical exam. Labs: Lab Results  Component Value Date   WBC 12.7 (H) 10/31/2022   HGB 10.6 (L) 10/31/2022   HCT 32.4 (L) 10/31/2022   MCV 79.8 (L) 10/31/2022   PLT 337 10/31/2022      Latest Ref Rng & Units 04/15/2018   10:00 PM  CMP  Glucose 70 - 99 mg/dL 106   BUN 6 - 20 mg/dL 9   Creatinine 0.44 - 1.00 mg/dL 0.71   Sodium 135 - 145 mmol/L 138   Potassium 3.5 - 5.1 mmol/L 3.3   Chloride 98 - 111 mmol/L 104   CO2 22 - 32 mmol/L 27   Calcium 8.9 - 10.3 mg/dL 9.2   Total Protein 6.5 - 8.1 g/dL 7.6   Total Bilirubin 0.3 - 1.2 mg/dL 0.2   Alkaline Phos 38 - 126 U/L 71   AST 15 - 41 U/L 17   ALT 0 - 44 U/L 15    Edinburgh Score:    10/30/2022   11:30 AM  Edinburgh Postnatal Depression Scale Screening Tool  I have been able to laugh and see the funny side of things. 0  I have looked forward with enjoyment to things. 0  I have blamed myself unnecessarily when things went wrong. 0  I have been anxious or worried for no good reason. 0  I have felt scared or panicky for no good reason. 0  Things have been getting on top of me. 0  I have been so unhappy that I have had difficulty sleeping. 0  I have felt sad or miserable. 0  I have been so unhappy that I have been crying. 0  The thought of harming myself has occurred to me. 0  Edinburgh Postnatal Depression Scale Total 0      After visit meds:  Allergies as of 10/31/2022   No Known Allergies      Medication List     STOP taking these medications    Doxylamine-Pyridoxine 10-10 MG Tbec       TAKE these medications    multivitamin-prenatal 27-0.8 MG Tabs tablet Take 1 tablet by mouth daily at 12 noon.   norgestimate-ethinyl estradiol 0.25-35 MG-MCG tablet Commonly known as: ORTHO-CYCLEN Take 1 tablet by mouth daily. Start taking on: November 29, 2022         Discharge home in stable condition Infant Feeding:  Formula Infant Disposition:home with mother Discharge instruction: per After Visit Summary and  Postpartum booklet. Activity: Advance as tolerated. Pelvic rest for 6 weeks.  Diet: routine diet Anticipated Birth Control: OCPs Postpartum Appointment: to be scheduled Additional Postpartum F/U:  PRN Future Appointments:No future appointments.  Follow up Visit:  Follow-up Information     Philip Aspen, CNM. Schedule an appointment as soon as possible for a visit in 6 week(s).   Specialties: Certified Nurse Midwife, Radiology Why: Call and schedule an appointment in 6-weeks for a visit with Philip Aspen, CNM at [facility]! Contact information: [address] [place] [address] [phone]                  10/31/2022 Rod Can, CNM

## 2022-10-31 DIAGNOSIS — Z3A40 40 weeks gestation of pregnancy: Secondary | ICD-10-CM

## 2022-10-31 DIAGNOSIS — O0930 Supervision of pregnancy with insufficient antenatal care, unspecified trimester: Secondary | ICD-10-CM

## 2022-10-31 DIAGNOSIS — O480 Post-term pregnancy: Secondary | ICD-10-CM

## 2022-10-31 DIAGNOSIS — O0933 Supervision of pregnancy with insufficient antenatal care, third trimester: Secondary | ICD-10-CM

## 2022-10-31 LAB — CBC
HCT: 32.4 % — ABNORMAL LOW (ref 36.0–46.0)
Hemoglobin: 10.6 g/dL — ABNORMAL LOW (ref 12.0–15.0)
MCH: 26.1 pg (ref 26.0–34.0)
MCHC: 32.7 g/dL (ref 30.0–36.0)
MCV: 79.8 fL — ABNORMAL LOW (ref 80.0–100.0)
Platelets: 337 10*3/uL (ref 150–400)
RBC: 4.06 MIL/uL (ref 3.87–5.11)
RDW: 14.9 % (ref 11.5–15.5)
WBC: 12.7 10*3/uL — ABNORMAL HIGH (ref 4.0–10.5)
nRBC: 0 % (ref 0.0–0.2)

## 2022-10-31 LAB — HEMOGLOBIN A1C
Hgb A1c MFr Bld: 5.7 % — ABNORMAL HIGH (ref 4.8–5.6)
Mean Plasma Glucose: 117 mg/dL

## 2022-10-31 MED ORDER — NORGESTIMATE-ETH ESTRADIOL 0.25-35 MG-MCG PO TABS: 1.0000 | ORAL_TABLET | Freq: Every day | ORAL | 11 refills | Status: AC

## 2022-10-31 NOTE — Progress Notes (Signed)
Patient discharged home with infant. FOB present at discharge.  Discharge instructions and prescriptions given and reviewed with patient. Patient verbalized understanding.   Explained importance of calling and scheduling follow-up appointment with Philip Aspen, CNM at [facility]!  Transitions of care spoke with the patient and deemed okay for discharge.  Will be escorted out by staff.

## 2022-10-31 NOTE — Progress Notes (Signed)
T-dap vaccine given at 1028 in R deltoid. Information sheet given to patient.

## 2022-10-31 NOTE — TOC Transition Note (Signed)
Transition of Care [facility]) - CM/SW Discharge Note   Patient Details  Name: Deanna Soto MRN: [id number] Date of Birth: [birth year]  [facility] CM/SW Contact:  Raina Mina, [place] Phone Number: 10/31/2022, 9:13 AM   Clinical Narrative:   CSW received a consult in regards to patient not having prenatal care during her pregnancy. CSW spoke with patient who stated she didn't have insurance. Patient stated that she spoke with someone yesterday who assisted her in filling out a medicaid application. Patient was aware of applying for medicaid through [facility]. Patient stated she has all the needed baby items. No concerns noted.           Patient Goals and CMS Choice      Discharge Placement                         Discharge Plan and Services Additional resources added to the After Visit Summary for                                       Social Determinants of Health (SDOH) Interventions SDOH Screenings   Food Insecurity: No Food Insecurity (10/30/2022)  Housing: Low Risk  (10/30/2022)  Transportation Needs: Unmet Transportation Needs (10/30/2022)  Utilities: Not At Risk (10/30/2022)  Financial Resource Strain: Medium Risk (03/30/2022)  Physical Activity: Insufficiently Active (03/30/2022)  Social Connections: Unknown (03/30/2022)  Stress: No Stress Concern Present (03/30/2022)  Tobacco Use: Low Risk  (10/30/2022)     Readmission Risk Interventions     No data to display

## 2022-12-09 ENCOUNTER — Telehealth: Payer: Self-pay

## 2022-12-09 NOTE — Telephone Encounter (Signed)
[facility]- Discharge Call Backs-Left Voicemail about the following below. 1-Do you have any questions or concerns about yourself as you heal? 2-Any concerns or questions about your baby? 3-How was your stay at the hospital? 4- Did our team work together to care for you? You should be receiving a survey in the mail soon.   We would really appreciate it if you could fill that out for us and return it in the mail.  We value the feedback to make improvements and continue the great work we do.   If you have any questions please feel free to call me back at [phone]
